# Patient Record
Sex: Male | Born: 1960 | Race: Black or African American | Hispanic: No | Marital: Single | Smoking: Former smoker
Health system: Southern US, Community
[De-identification: ages and names within clinical notes are randomized; demographics above are authoritative.]

## PROBLEM LIST (undated history)

## (undated) DIAGNOSIS — R51 Headache: Secondary | ICD-10-CM

## (undated) DIAGNOSIS — J3489 Other specified disorders of nose and nasal sinuses: Secondary | ICD-10-CM

## (undated) DIAGNOSIS — R519 Headache, unspecified: Secondary | ICD-10-CM

## (undated) DIAGNOSIS — M629 Disorder of muscle, unspecified: Secondary | ICD-10-CM

## (undated) HISTORY — DX: Disorder of muscle, unspecified: M62.9

## (undated) HISTORY — PX: PR UNLISTED PROCEDURE FOOT/TOES: 28899

## (undated) HISTORY — PX: FOOT SURGERY: SHX648

---

## 2011-03-26 ENCOUNTER — Ambulatory Visit (HOSPITAL_BASED_OUTPATIENT_CLINIC_OR_DEPARTMENT_OTHER): Payer: Self-pay | Attending: Physician Assistant

## 2011-03-26 ENCOUNTER — Ambulatory Visit (HOSPITAL_BASED_OUTPATIENT_CLINIC_OR_DEPARTMENT_OTHER): Payer: Self-pay | Admitting: Physician Assistant

## 2011-03-26 VITALS — BP 120/74 | HR 67 | Temp 97.7°F | Resp 18 | Wt 202.0 lb

## 2011-03-26 NOTE — Progress Notes (Signed)
Pt presents to the [facility] triage nurse today c/o chronic B ear discomfort and vertigo. Pt is triaged to walk-in appt at [facility] today. Pt verbalizes understanding and agreement with plan.

## 2011-03-29 NOTE — Progress Notes (Signed)
Outpatient Clinic  Note      DATE: 03/29/2011     Cristian Baker is a 50 year old male who is here today for walk in appt. An interpreter was not needed for the visit.    CHIEF COMPLAINT:  Chief Complaint   Patient presents with   . Ear Problem   . Dizziness       PAST MEDICAL HISTORY:  No past medical history on file.    PROBLEM LIST:  There is no problem list on file for this patient.      ALLERGIES:  Review of patient's allergies indicates no known allergies.    MEDICATIONS:  No current outpatient prescriptions on file.         INTERVAL HISTORY:  50 y/o male in today with c/o 2 month hx of vertigo, patient states that when sxs onset he was transferred to hospital in [place] ti r/o stroke. He underwent head imaging (NOS) and was told that it was nml. He has since been taking meclizine and sudafed, which he states helps. He denies any feelings of vertigo in the past 2 weeks, but states he still feels "foggy headed in the am "     REVIEW OF SYSTEMS:    CONSTITUTIONAL: Denies, fatigue, unexpected weight loss, unexpected weight gain, fever, chills  OPHTHALMIC: Denies, change in vision, blurry vision, diplopia, eye pain  RESPIRATORY: Denies, dyspnea on exertion  CARDIOVASCULAR: Denies, chest pain or pressure at rest or during exercise  NEUROLOGIC: Denies, headaches, focal weakness, signficant memory problems, sensory deficits, paresthesias, tremors, .  Does have and dizziness   PSYCHOSOCIAL: Denies and any psychological problems      SOCIAL HISTORY:  Living:  Homeless  Marital status: single  Employment history:   Substance Abuse: No  Comments:     PHYSICAL EXAM:  BP 120/74  Pulse 67  Temp(Src) 97.7 F (36.5 C) (Temporal)  Resp 18  Wt 202 lb (91.627 kg)  SpO2 100%      Skin:  Normal texture.  No suspicious skin lesions.  No rash.    Eyes:  + proptosis Conjunctiva and sclera clear. EOM full without nystagmus.   Pupils PERRLA with direct and consensual reflexes intact.  Fundoscopic exam shows sharp  disks, normal ZOX:WRUE ratio, and normal vasculature.    Ears, Nose, Mouth, Throat:  External ear and ear canal normal.  Tympanic membranes retracted.  Nose shows midline septum and normal pink nasal mucosa without discharge.  Mouth shows normal buccal mucosa.  Tongue normal.  Floor of mouth normal.  Oropharynx normal.  Neck supple without thyromegaly or lymphadenopathy.    Respiratory:  Normal chest excursions.  Clear to ausculation and percussion posteriorly.    Cardiovascular:  Ausculation normal without murmur, gallop, or rub.  Heart rate regular.   No carotid bruits.      Neurologic:  Cranial nerves II-XII grossly intact. Nystagmus noted at extreme endoints  Peripheral sensory and motor exams normal.  Reflexes at knees and ankles symmetrical and normal.   Gait normal.    Psychiatric:  Orients x 4.  Affect appropriate and not depressed talkative .  Thought patterns normal and not tangential.        ASSESSMENT AND PLAN:  Cristian Baker was seen today for ear problem and dizziness.    Diagnoses and associated orders for this visit:    Dizziness and giddiness possibly resolved   Hold meclazine - which may be contributing to his "foggy headedness"   Consider   - Fluticasone  Propionate 50 MCG/ACT Nasal Suspension; Spray 2 times into each nostril daily    Proptosis  -  considerTHYROID STIMULATING HORMONE  F/u in 1 week for re assessment and further intake

## 2011-04-08 ENCOUNTER — Ambulatory Visit (HOSPITAL_BASED_OUTPATIENT_CLINIC_OR_DEPARTMENT_OTHER): Payer: Self-pay | Attending: Physician Assistant | Admitting: Physician Assistant

## 2011-04-08 ENCOUNTER — Other Ambulatory Visit (HOSPITAL_BASED_OUTPATIENT_CLINIC_OR_DEPARTMENT_OTHER): Payer: Self-pay

## 2011-04-08 VITALS — BP 122/76 | HR 55 | Temp 96.1°F | Resp 20 | Wt 199.5 lb

## 2011-04-08 LAB — LIPID PANEL
Cholesterol (LDL): 160 mg/dL — ABNORMAL HIGH (ref <130)
Cholesterol/HDL Ratio: 4.9
HDL Cholesterol: 45 mg/dL (ref >40)
Non-HDL Cholesterol: 176 mg/dL — ABNORMAL HIGH (ref 0–159)
Total Cholesterol: 221 mg/dL — ABNORMAL HIGH (ref <200)
Triglyceride: 80 mg/dL (ref <150)

## 2011-04-08 LAB — COMPREHENSIVE METABOLIC PANEL
ALT (GPT): 23 U/L (ref 10–48)
AST (GOT): 22 U/L (ref 15–40)
Albumin: 4.3 g/dL (ref 3.5–5.2)
Alkaline Phosphatase (Total): 65 U/L (ref 39–139)
Anion Gap: 7 (ref 3–11)
Bilirubin (Total): 1.6 mg/dL — ABNORMAL HIGH (ref 0.2–1.3)
Calcium: 9.7 mg/dL (ref 8.9–10.2)
Carbon Dioxide, Total: 29 mEq/L (ref 22–32)
Chloride: 103 mEq/L (ref 98–108)
Creatinine: 0.9 mg/dL (ref 0.51–1.18)
GFR, Calc, African American: >60 mL/min (ref >59)
GFR, Calc, European American: >60 mL/min (ref >59)
Glucose: 83 mg/dL (ref 62–125)
Potassium: 4.5 mEq/L (ref 3.7–5.2)
Protein (Total): 7.3 g/dL (ref 6.0–8.2)
Sodium: 139 mEq/L (ref 136–145)
Urea Nitrogen: 13 mg/dL (ref 8–21)

## 2011-04-08 LAB — 1ST EXTRA GOLD TOP

## 2011-04-08 LAB — THYROID STIMULATING HORMONE: Thyroid Stimulating Hormone: 0.837 u[IU]/mL (ref 0.400–5.000)

## 2011-04-08 NOTE — Progress Notes (Signed)
Outpatient Clinic  Note        Cristian Baker is a 50 year old male who is here today for walk in appt. An interpreter was not needed for the visit.    CHIEF COMPLAINT:  Chief Complaint   Patient presents with   . Follow-Up        PAST MEDICAL HISTORY:  No past medical history on file.    PROBLEM LIST:  There is no problem list on file for this patient.      ALLERGIES:  Review of patient's allergies indicates no known allergies.    MEDICATIONS:  No current outpatient prescriptions on file.         INTERVAL HISTORY:  50 y/o male in today with c/o 2 month hx of vertigo, patient states that when sxs onset he was transferred to hospital in [place] ti r/o stroke. He underwent head imaging (NOS) and was told that it was nml. He has since been taking meclizine and sudafed, which he states helps. He denies any feelings of vertigo in the past 2 weeks, but states he still feels "foggy headed in the am " Seen x 2 weeks ago and instructed to hold meclazine. He has and states he has 0 dizziness in the interim.          REVIEW OF SYSTEMS:    CONSTITUTIONAL: Denies, fatigue, unexpected weight loss, unexpected weight gain, fever, chills  OPHTHALMIC: Denies, change in vision, blurry vision, diplopia, eye pain  RESPIRATORY: Denies, dyspnea on exertion  CARDIOVASCULAR: Denies, chest pain or pressure at rest or during exercise  NEUROLOGIC: Denies, headaches, focal weakness, signficant memory problems, sensory deficits, paresthesias, tremors, .  Does have and dizziness   PSYCHOSOCIAL: Denies and any psychological problems      SOCIAL HISTORY:  Living:  Homeless  Marital status: single  Employment history:   Substance Abuse: No  Comments:     PHYSICAL EXAM:  BP 122/76  Pulse 55  Temp(Src) 96.1 F (35.6 C) (Temporal)  Resp 20  Wt 199 lb 8 oz (90.493 kg)  SpO2 100%      Skin:  Normal texture.  No suspicious skin lesions.  No rash.    Respiratory:  Normal chest excursions.  Clear to ausculation and percussion  posteriorly.    Cardiovascular:  Ausculation normal without murmur, gallop, or rub.  Heart rate regular.   No carotid bruits.      Psychiatric:  Orients x 4.  Affect appropriate and not depressed talkative .  Thought patterns normal and not tangential.        ASSESSMENT AND PLAN:  Cristian Baker was seen today for ear problem and dizziness.    Diagnoses and associated orders for this visit:    Dizziness and giddiness possibly resolved   Continue to hold meclazine and use on PRN    Routine general medical examination at a health care facility  - LIPID PANEL  - THYROID STIMULATING HORMONE  - COMPREHENSIVE METABOLIC PANEL  - TDAP VACC, >7 YRS, 0.5 ML/IM    Will place on wait list for primary care.  F/u prn

## 2011-04-08 NOTE — Progress Notes (Signed)
VACCINE SCREENING/ORDER WHEN CONTRAINDICATIONS PRESENT    Order date: 04/08/2011  Ordering provider: Frederik Pear  Clinic stock used: YES   Interpreter used?: None  Vaccine information sheet(s) discussed, patient/parent/guardian verbalized understanding? YES   Vaccines given: Tdap Vaccine    SCREENING: INACTIVATED VACCINES  NO 1. Do you have a high fever, severe cold-like symptoms or serious infection?  NO 2. Do you have any food allergies such as eggs, chicken, chicken feathers, chicken dander, or gelatin?  NO 3. Do you have any allergies to neomycin, streptomycin, or polymyxin?   NO 4. Have you had any severe reaction to a vaccine in the past such as a seizure, a convulsion from a high fever, or a paralysis problem called Guillain-Barre Syndrome? If so, which vaccine(s):     Cristian Baker, Cristian Baker, 04/08/2011 8:08 AM

## 2011-07-11 ENCOUNTER — Ambulatory Visit (HOSPITAL_BASED_OUTPATIENT_CLINIC_OR_DEPARTMENT_OTHER)
Payer: Medicaid Other | Attending: Rehabilitative and Restorative Service Providers" | Admitting: Rehabilitative and Restorative Service Providers"

## 2011-07-11 ENCOUNTER — Emergency Department (HOSPITAL_BASED_OUTPATIENT_CLINIC_OR_DEPARTMENT_OTHER)
Admission: EM | Admit: 2011-07-11 | Discharge: 2011-07-11 | Disposition: A | Discharge status: Home / Self Care | Payer: Medicaid Other | Attending: Physician Assistant | Admitting: Physician Assistant

## 2011-07-11 DIAGNOSIS — M4120 Other idiopathic scoliosis, site unspecified: Secondary | ICD-10-CM | POA: Insufficient documentation

## 2011-07-11 DIAGNOSIS — M545 Low back pain, unspecified: Secondary | ICD-10-CM | POA: Insufficient documentation

## 2011-07-11 DIAGNOSIS — IMO0001 Reserved for inherently not codable concepts without codable children: Secondary | ICD-10-CM | POA: Insufficient documentation

## 2011-08-13 ENCOUNTER — Ambulatory Visit (HOSPITAL_BASED_OUTPATIENT_CLINIC_OR_DEPARTMENT_OTHER): Payer: Medicaid Other | Attending: Internal Medicine | Admitting: Internal Medicine

## 2011-08-13 ENCOUNTER — Encounter (HOSPITAL_BASED_OUTPATIENT_CLINIC_OR_DEPARTMENT_OTHER): Payer: Self-pay | Admitting: Internal Medicine

## 2011-08-13 ENCOUNTER — Other Ambulatory Visit (HOSPITAL_BASED_OUTPATIENT_CLINIC_OR_DEPARTMENT_OTHER): Payer: Self-pay | Admitting: Internal Medicine

## 2011-08-13 VITALS — BP 100/68 | HR 62 | Resp 18 | Ht 68.0 in | Wt 200.0 lb

## 2011-08-13 DIAGNOSIS — Z8673 Personal history of transient ischemic attack (TIA), and cerebral infarction without residual deficits: Secondary | ICD-10-CM | POA: Insufficient documentation

## 2011-08-13 DIAGNOSIS — M4120 Other idiopathic scoliosis, site unspecified: Secondary | ICD-10-CM | POA: Insufficient documentation

## 2011-08-13 DIAGNOSIS — R209 Unspecified disturbances of skin sensation: Secondary | ICD-10-CM | POA: Insufficient documentation

## 2011-08-13 DIAGNOSIS — Z139 Encounter for screening, unspecified: Secondary | ICD-10-CM | POA: Insufficient documentation

## 2011-08-13 DIAGNOSIS — M549 Dorsalgia, unspecified: Secondary | ICD-10-CM | POA: Insufficient documentation

## 2011-08-13 LAB — CBC (HEMOGRAM)
Hematocrit: 43 % (ref 38–50)
Hemoglobin: 13.9 g/dL (ref 13.0–18.0)
MCH: 30 pg (ref 27.3–33.6)
MCHC: 32.6 g/dL (ref 32.2–36.5)
MCV: 92 fL (ref 81–98)
Platelet Count: 133 10*3/uL — ABNORMAL LOW (ref 150–400)
RBC: 4.63 mil/uL (ref 4.40–5.60)
RDW-CV: 13.5 % (ref 11.6–14.4)
WBC: 7.06 10*3/uL (ref 4.30–10.00)

## 2011-08-13 LAB — VITAMIN D (25 HYDROXY)

## 2011-08-13 LAB — FOLATE & VITAMIN B12
Folate, SRM: 16.6 ng/mL (ref >5.8)
Vitamin B12 (Cobalamin): 281 pg/mL (ref 180–914)
Vitamin B12 (Cobalamin): NORMAL

## 2011-08-13 LAB — 1ST EXTRA LIME GREEN TOP

## 2011-08-13 MED ORDER — SIMVASTATIN 20 MG OR TABS
ORAL_TABLET | ORAL | Status: DC
Start: 2011-08-13 — End: 2011-11-26

## 2011-08-13 MED ORDER — ASPIRIN 81 MG OR TBEC
DELAYED_RELEASE_TABLET | ORAL | Status: AC
Start: 2011-08-13 — End: ?

## 2011-08-13 NOTE — Progress Notes (Addendum)
[facility] NOTE:    Chief Complaint   Patient presents with   . Establish Care       Cristian Baker is a 50 year old male who presents to the [facility] for care of the following problems:  1. History of vertigo. This first started acutely in June 2012. Was hospitalized in [place]. MRI brain showed a possible small subacute infarct in the midbrain. Carotid duplex was negative for high grade stenosis and TTE was negative for PFO. HgA1C 5.4, TSH 0.463, TChol 199, LDL 146, HDL 49.  EKG within normal limits. Neurology recommended that patient take aspirin and statin for secondary prevention. He was also discharge on meclizine for symptomatic relief. He has been seen in this clinic since and was taken off his meclizine. Today, Cristian Baker reports that his vertigo has improved.   2. Other complains of scoliosis and chronic back pain. Was seen in [facility] ED last month. XR noted sever thoracolumbar scoliosis. He was referred to PT on NOvember 25, but has not seen then since (states his phone was not working, but he has the phone number and plans on calling to schedule an appointment). He was also prescribed a muscle relaxant that helps a little. His back pain is better when he lays flat, worse with walking or sitting too long. Endorses periodic tingling and numbness. Denies weakness, has never fallen. Denies bladder or bowel incontinence.  3. Experiences seasonal allergies, worse in the summer time.    Cristian Baker denies any other medical history, hospitalizations, surgeries. Has never had a PCP before.    ALLERGIES  Review of patient's allergies indicates no known allergies.    MEDs:  None    FH:  HTN  Grandfather - MI/CVA  Denies FH of cancer.    SH:  [facility]. Originally from [place], lived in [place], [place] and [place] before moving to [place] 4 months ago.  Tobacco: Quit smoking 23 years ago (prior 63 pack/year history).  EtOH: Denies. Never previous heavy  use.  Illicits: Denies. Endorses prior h/o intranasal cocaine. Never used IVDU.    ROS:  Constitutional  - denies F/C, NS, weight loss  Eyes  - denies blurry vision or diplopia  ENMT  - denies rhinorrhea, sore throat. Endorses endorses occasional sinus congestion.    Cardiovascular  - denies CP, palpitations, orthopnea, PND, edema   Respiratory  - denies SOB, cough. Occasional mild DOE.  GI  - endorses abdominal hernia. denies N/V, D/C, melena, BRBPR    Genitourinary - denies increased frequency, urgency, hematuria, or dysuria    Neuro  - denies HA, weakness, left leg sensory loss, h/o sz, falls, LOC. Endorses occasional leg tingling.  Musculoskeletal  - endorses chronic back pain.  Skin   - denies rashes or lesions    Endocrine   - denies feelings of hot/cold, appetite changes     Heme/Lymph  - denies LAD, bruising, gum bleeding, epistaxis    Psychiatric   - endorses mild anxiety, depression secondary to homelessness   Allergy/Immunology- endorses h/o allergies. denies multiple infections/immune deficiency    Physical Examination:    BP 100/68  Pulse 62  Resp 18  Ht 5\' 8"  (1.727 m)  Wt 200 lb (90.719 kg)  BMI 30.41 kg/m2  PHYSICAL EXAM:  Gen - Well appearing, alert, NAD, pleasant  HEENT- EOMI, no scleral icterus, MMM   Lungs- breathing comfortably  CV: warm extremities, no edema  Abd: non distended  Ext: WWP, no edema, MAE  Skin: No rashes or lesions. No cyanosis or erythema.  Psych: Appropriate, normal affect, no apparent mood disturbances.   Neuro:  Mental Status: alert & oriented X 4   Cranial Nerves: II-XII grossly intact   Motor tone: normal   Atrophy/Fasiculations: absent   Strength: 5/5 in all muscle groups of the lower extremities  EXCEPT 4/5 in bilateral hip and knee extension.   Sensory: decreased throughout lateral left leg    Labs:   No visits with results within 3 Month(s) from this visit.  Latest known visit with results is:    Orders Only on 04/08/2011   Component Date Value   . 1st Extra Gold  Top 04/08/2011 Additional collection tube        Assessment and Plan:   Cristian Baker is a 50 year old male who presents with:       Cristian Baker was seen today for establish care.    Diagnoses and associated orders for this visit:    Encounter to establish care/ Screening, routine.  - HEPATITIS A,B,& C PANEL  - CBC (HEMOGRAM)  - Hemoccult x 1-3 for colon cancer screening, Future (on site resulted); Future    Paresthesia. Likely secondary to scoliosis as below. However will also work-up metabolic causes as patient has not had medical care previously.  - VITAMIN D (25 HYDROXY)  - FOLATE & VITAMIN B12  - SEROLOGIC SYPHILIS PANEL, SRM    Scoliosis/ Back pain/ weakness  -- patient will call PT and schedule appointment  -- Readdress EMG at next visit in light of weakness    H/o: cva (cardiovascular accident)/ Hyperlipidemia  - Re-prescribe Simvastatin 20 MG Oral Tab; Take 1 tablet by mouth every evening  - Re-prescribe Aspirin 81 MG Oral Tab EC; Take 1 tablet by mouth daily    Follow up Plan:  Return in about 2 weeks (around 08/27/2011) for with Milly Goggins. nursing visit next week for blood draw as we were not able to draw all labs while patient in clinic.    Patient was discussed with Dr. Antonieta Loveless.    Lillie Columbia, MD  Internal Medicine [facility]  [phone]     -------------------------------------------  Attending: Doristine Bosworth, MD  I discussed the history, exam and medical decision making for this patient with Dr.  Valla Leaver. I was present in the clinic during the provisions of these services with no other clinical duties. I concur with the management plan as discussed.  -------------------------------------------

## 2011-08-15 LAB — VITAMIN D (25 HYDROXY)
Vit D (25_Hydroxy) Total: 13.7 ng/mL — ABNORMAL LOW (ref 20.1–50.0)
Vitamin D2 (25_Hydroxy): <1 ng/mL
Vitamin D3 (25_Hydroxy): 13.7 ng/mL

## 2011-08-16 LAB — HEPATITIS A,B,& C PANEL
Hepatitis A Ab: NONREACTIVE
Hepatitis B Core Ab: NONREACTIVE
Hepatitis B Surf Antibody Intl Units: <1 IU
Hepatitis B Surface Ab: NONREACTIVE
Hepatitis B Surface Antigen w/Reflex: NONREACTIVE
Hepatitis C Antibody w/Rflx PCR: NONREACTIVE

## 2011-08-18 ENCOUNTER — Encounter (HOSPITAL_BASED_OUTPATIENT_CLINIC_OR_DEPARTMENT_OTHER): Payer: No Typology Code available for payment source

## 2011-08-18 LAB — SEROLOGIC SYPHILIS PANEL, SRM

## 2011-08-20 NOTE — Addendum Note (Signed)
Addended by: Doristine Bosworth on: 08/20/2011 09:00 PM     Modules accepted: Level of Service

## 2011-08-31 ENCOUNTER — Encounter (HOSPITAL_BASED_OUTPATIENT_CLINIC_OR_DEPARTMENT_OTHER): Payer: Medicaid Other | Admitting: Rehabilitative and Restorative Service Providers"

## 2011-09-03 ENCOUNTER — Other Ambulatory Visit (HOSPITAL_BASED_OUTPATIENT_CLINIC_OR_DEPARTMENT_OTHER): Payer: Self-pay | Admitting: Internal Medicine

## 2011-09-07 ENCOUNTER — Ambulatory Visit (HOSPITAL_BASED_OUTPATIENT_CLINIC_OR_DEPARTMENT_OTHER)
Payer: No Typology Code available for payment source | Attending: Physician Assistant | Admitting: Rehabilitative and Restorative Service Providers"

## 2011-09-07 DIAGNOSIS — IMO0001 Reserved for inherently not codable concepts without codable children: Secondary | ICD-10-CM | POA: Insufficient documentation

## 2011-09-07 DIAGNOSIS — M5380 Other specified dorsopathies, site unspecified: Secondary | ICD-10-CM | POA: Insufficient documentation

## 2011-09-07 DIAGNOSIS — M545 Low back pain, unspecified: Secondary | ICD-10-CM | POA: Insufficient documentation

## 2011-09-10 ENCOUNTER — Ambulatory Visit (HOSPITAL_BASED_OUTPATIENT_CLINIC_OR_DEPARTMENT_OTHER): Payer: No Typology Code available for payment source | Attending: Internal Medicine | Admitting: Internal Medicine

## 2011-09-10 VITALS — BP 98/66 | HR 69 | Temp 97.5°F | Resp 20 | Wt 203.5 lb

## 2011-09-10 DIAGNOSIS — Z8673 Personal history of transient ischemic attack (TIA), and cerebral infarction without residual deficits: Secondary | ICD-10-CM | POA: Insufficient documentation

## 2011-09-10 DIAGNOSIS — M4120 Other idiopathic scoliosis, site unspecified: Secondary | ICD-10-CM | POA: Insufficient documentation

## 2011-09-10 DIAGNOSIS — E559 Vitamin D deficiency, unspecified: Secondary | ICD-10-CM | POA: Insufficient documentation

## 2011-09-10 DIAGNOSIS — E785 Hyperlipidemia, unspecified: Secondary | ICD-10-CM | POA: Insufficient documentation

## 2011-09-10 MED ORDER — ERGOCALCIFEROL 1.25 MG (50000 UT) OR CAPS
ORAL_CAPSULE | ORAL | Status: AC
Start: 2011-09-10 — End: ?

## 2011-09-15 NOTE — Progress Notes (Signed)
[facility] NOTE:    No chief complaint on file.      Cristian Baker is a 51 year old male who presents to the [facility] for care of the following problems:     Cristian Baker returns to clinic today for lab results. At our last visit we drew lipids (patient has a history of HLD, but had been off his statin), folate/B12/VitD for paresthesias, and hepatitis panel for routine screening. His results are listed below.     Today he denies any current complaints or concerns. His scoliosis is stable, he saw PT and learn soon exercises he can do at home. Denies that his back pain, weakness and paresthesias are worse than ever before, reports his symptoms secondary to scoliosis have been stable for decades. Has not interest in undergoing EMG at this time, but reports that he will tell me if any of his symptoms change or worsen.  Denies bowel or bladder symptoms. Denies falls or numbness.    Patient Active Problem List   Diagnoses Date Noted   . Scoliosis [737.30] 08/16/2011     XR 07/11/11: severe thoracolumbar scoliosis.  To see PT soon.       . Paresthesias [782.0] 08/16/2011     Likely secondary to scoliosis, but checked RPR, Vitamin B12, folate, Vitamin D and electrolytes on 08/13/11. TSH normal in June.     . Weakness [780.79] 08/16/2011     Bilateral hip and knee extension. However difficult to tell if exam was limited by pain.   Discuss EMG at next appointment     . H/O: CVA (cardiovascular accident) [V12.54] 08/16/2011     Presented with vertigo. Small infarct in midbrain on MRI in June 2012 (in [place]). Carotid duplex and TTE normal. Prescribed aspirin and statin for secondary prevention.     . Seasonal allergies [477.9] 08/16/2011     Worse in summer time, both inside and outside.     Marland [facility] Healthcare maintenance [V70.0] 08/13/2011     -- vaccinations to discuss at future visit:   - Influenza - declined, worried about what they are putting in it.    - Pneumovax (if chronic dz, cigarrette  smokers, or >65yo. Revaccinate if receive vaccine when <65yo)    - Tetanus - aug 2012   - Zoster (if age >98yo)  -- HIV testing- tested negative one month ago at [facility] (?)  -- Hepatitis testing- 08/13/11  -- TB screening- declined PPD testing today  -- CV disease prevention: aspirin and statin restarted today (08/13/11).  -- HTN: BP WNLs (<140/90).  -- AAA screening with ultrasound not currently indicated: patient <65yo  -- Diabetes: hemoglobin A1C: 5.4% in June 2012  -- Cancer screening to discuss at future visit:   - Prostate: denies symptoms. No FH.                 - Colon: annual FOBT provided today (08/13/11)  -- Depression: assess mood and screen for anhedonia NEXT VISIT  -- Osteoporosis: DEXA scan (women > 65yo) -- n/a  -- Injury prevention (in elderly): n/a                  - review medications to minimize psychotropic medications                 - encourage weight-bearing activity                 - lab draw for Vitamin D today, 08/13/11                 -  if recent fall, perform Get Up and Go test  -- Domestic Violence screen: not indicated  -- Substance abuse: denies  -- Sexually transmitted infections: address at NEXT VISIT                 - Chlamydia PCR from urine or vaginal swab (sexually active women age 68-25, pregnant women, older    women or men with risk factors)                  - Romeo Rabon (women age 6-29 who live in high risk areas)  -- Hearing and vision screening (age > 6) - n/a   - Snellen chart   - "whispered voice test"          No past medical history on file.    ALLERGIES  Seasonal    Outpatient Prescriptions Prior to Visit   Medication Sig Dispense Refill   . Aspirin 81 MG Oral Tab EC Take 1 tablet by mouth daily  30 Tab  3   . Simvastatin 20 MG Oral Tab Take 1 tablet by mouth every evening  30 Tab  3       History     Social History   . Marital Status: Single     Spouse Name: N/A     Number of Children: N/A   . Years of Education: N/A     Social History Main Topics   . Smoking  status: Former Games developer   . Smokeless tobacco: Not on file   . Alcohol Use: No   . Drug Use: No   . Sexually Active: Not on file     Other Topics Concern   . Not on file     Social History Narrative   . No narrative on file         Physical Examination:    BP 98/66  Pulse 69  Temp(Src) 97.5 F (36.4 C) (Temporal)  Resp 20  Wt 203 lb 8 oz (92.307 kg)  SpO2 100%  PHYSICAL EXAM:  Gen - Well appearing, alert, NAD, pleasant  Lungs- breathing comfortable  Psych: Appropriate, normal affect, no apparent mood disturbances.     Labs:   Orders Only on 08/13/2011   Component Date Value   . 1st Extra Lime Green Top 08/13/2011 Additional collection tube    . Vitamin D2 (25_Hydroxy) 08/13/2011 <1.0    . Vitamin D3 (25_Hydroxy) 08/13/2011 13.7    . Vit D (25_Hydroxy) Total 08/13/2011 13.7*   . Vit D (25_Hydroxy) Interp 08/13/2011 Deficient:         8.0-20.0 ng/mL    . Vit D (25_Hydroxy) Interp 08/13/2011                      Value:For more information see                          [email or web address]                                                    .   Office Visit on 08/13/2011   Component Date Value   . HEPATITIS B_S AG 08/13/2011 Nonreactive    . HEPATITIS B_S AB 08/13/2011 Nonreactive    . INTERNATIONAL UNITS of A* 08/13/2011 <1.0    .  INTERNATIONAL UNITS of A* 08/13/2011 Protective level approximately 10 IU.    Marland Kitchen HEPATITIS B CORE AB 08/13/2011 Nonreactive    . HEPATITIS A AB 08/13/2011 Nonreactive    . IMMUNOGLOBULIN CLASS 08/13/2011 Test not applicable    . INTERPRETATION 08/13/2011                      Value:No evidence of past or present Hepatitis A or B                          infection.   Marland Kitchen HEPATITIS C AB W/RFLX PCR 08/13/2011 Nonreactive    . INTERPRETATION 08/13/2011                      Value:No evidence of antibody to hepatitis C virus (HCV).                          Anti-HCV may develop slowly (6 weeks - 6 months) over                          the course of hepatitis C virus  infection.  Rarely it                          may be absent in cases of chronic infection.  Anti-HCV                          may also disappear after recovery from acute,                          self-limited disease.   . WBC 08/13/2011 7.06    . RBC 08/13/2011 4.63    . HEMOGLOBIN 08/13/2011 13.9    . HEMATOCRIT 08/13/2011 43    . MCV 08/13/2011 92    . [facility] 08/13/2011 30.0    . MCHC 08/13/2011 32.6    . PLATELET COUNT 08/13/2011 133*   . RDW-CV 08/13/2011 13.5    . Vitamin D2 (25_Hydroxy) 08/13/2011 Reorder requested, quantity not sufficient    . Vitamin D2 (25_Hydroxy) 08/13/2011 RELOGGED TO PST TUBE SEE [id number]    . Vitamin D3 (25_Hydroxy) 08/13/2011 Reorder requested, quantity not sufficient    . Vitamin D3 (25_Hydroxy) 08/13/2011 RELOGGED TO PST TUBE SEE [id number]    . Vit D (25_Hydroxy) Total 08/13/2011 Reorder requested, quantity not sufficient    . Vit D (25_Hydroxy) Total 08/13/2011 RELOGGED TO PST TUBE SEE [id number]    . Vit D (25_Hydroxy) Interp 08/13/2011 Reorder requested, quantity not sufficient    . Vit D (25_Hydroxy) Interp 08/13/2011 RELOGGED TO PST TUBE SEE [id number]    . FOLATE 08/13/2011 16.6    . FOLATE 08/13/2011 Reference range >5.8 ng/mL    . FOLATE 08/13/2011 Folate deficiency <4.0 ng/mL    . VITAMIN B12 (COBALAMIN) 08/13/2011 281    . VITAMIN B12 (COBALAMIN) 08/13/2011        Normal:  180-914 pg/mL    . VITAMIN B12 (COBALAMIN) 08/13/2011 Indeterminate:  145-179 pg/mL    . VITAMIN B12 (COBALAMIN) 08/13/2011     Deficient:  <145 pg/mL    . Syphilis IgG Ab Result 08/13/2011 Reorder requested, quantity not sufficient*   . Syphilis IgG Ab Result 08/13/2011 PER IMMUNOLOGY [place]    . Syphilis  IgG Comment 08/13/2011 Reorder requested, quantity not sufficient    . Syphilis IgG Comment 08/13/2011 PER IMMUNOLOGY [place]    . Syphilis Screening Status 08/13/2011 Reorder requested, quantity not sufficient*   . Syphilis Screening Status 08/13/2011 PER IMMUNOLOGY [place]        Assessment and Plan:   Cristian Baker is  a 51 year old male who presents with:       Diagnoses and associated orders for this visit:    Vitamin d deficiency  - Ergocalciferol 50000 UNIT Oral Cap; take 1 capsule every Friday for the next 8 weeks  - recheck vitamin D level in 8.5 weeks    Hyperlipidemia, h/o CVA. Patient did not pick up simvastatin and ASA after our last visit for unclear reasons. He agrees to pick up today and restart.  - simvastatin 40mg / day  - aspirin 81mg  daily  - recheck lipids in 8.5 weeks    Scoliosis  - continue exercises as per PT  - consider EMG if symptoms change or worsen    Follow up Plan:  Return in about 9 weeks (around 11/12/2011).    Patient was discussed with Dr. Dawson Bills, MD  Internal Medicine [facility]  [phone]

## 2011-09-15 NOTE — Progress Notes (Signed)
Addition to assessment/plan:    HCM:  Hep A/B/C negative. Patient declined HepA/B vaccine, but agrees to continue to consider.  Influenza- [facility] the vaccine [facility] at the shelter.

## 2011-09-17 NOTE — Progress Notes (Signed)
-------------------------------------------    Attending: Lota Leamer Marion Katherleen Folkes, MD  I discussed the history, exam and medical decision making for this patient with Dr. DEMARS, SANDRA MARIE. I was present in the clinic during the provisions of these services with no other clinical duties. I concur with the management plan as noted above.  -------------------------------------------

## 2011-11-12 ENCOUNTER — Ambulatory Visit (HOSPITAL_BASED_OUTPATIENT_CLINIC_OR_DEPARTMENT_OTHER): Payer: 59 | Attending: Internal Medicine

## 2011-11-12 DIAGNOSIS — E785 Hyperlipidemia, unspecified: Secondary | ICD-10-CM | POA: Insufficient documentation

## 2011-11-12 DIAGNOSIS — E559 Vitamin D deficiency, unspecified: Secondary | ICD-10-CM | POA: Insufficient documentation

## 2011-11-12 DIAGNOSIS — Z0189 Encounter for other specified special examinations: Secondary | ICD-10-CM | POA: Insufficient documentation

## 2011-11-12 LAB — LIPID PANEL
Cholesterol (LDL): 134 mg/dL — ABNORMAL HIGH (ref <130)
Cholesterol/HDL Ratio: 5.2
HDL Cholesterol: 38 mg/dL — ABNORMAL LOW (ref >40)
Non-HDL Cholesterol: 160 mg/dL — ABNORMAL HIGH (ref 0–159)
Total Cholesterol: 198 mg/dL (ref <200)
Triglyceride: 130 mg/dL (ref <150)

## 2011-11-12 NOTE — Progress Notes (Signed)
[name] blood work drawn in clinic today.  Ordered by: Lillie Columbia  Performed by: Meredith Mody, MA  Patient left without complications.    Aniel, Cristoper, 11/12/2011 11:20 AM    Venipuncture Fee Ordered: yes

## 2011-11-14 LAB — VITAMIN D (25 HYDROXY)
Vit D (25_Hydroxy) Total: 12.9 ng/mL — ABNORMAL LOW (ref 20.1–50.0)
Vitamin D2 (25_Hydroxy): <1 ng/mL
Vitamin D3 (25_Hydroxy): 12.9 ng/mL

## 2011-11-26 ENCOUNTER — Encounter (HOSPITAL_BASED_OUTPATIENT_CLINIC_OR_DEPARTMENT_OTHER): Payer: Self-pay | Admitting: Internal Medicine

## 2011-11-26 ENCOUNTER — Ambulatory Visit (HOSPITAL_BASED_OUTPATIENT_CLINIC_OR_DEPARTMENT_OTHER): Payer: 59 | Attending: Internal Medicine | Admitting: Internal Medicine

## 2011-11-26 VITALS — BP 112/64 | HR 88 | Resp 18 | Ht 68.0 in | Wt 214.5 lb

## 2011-11-26 DIAGNOSIS — M549 Dorsalgia, unspecified: Secondary | ICD-10-CM | POA: Insufficient documentation

## 2011-11-26 DIAGNOSIS — M4150 Other secondary scoliosis, site unspecified: Secondary | ICD-10-CM | POA: Insufficient documentation

## 2011-11-26 DIAGNOSIS — E559 Vitamin D deficiency, unspecified: Secondary | ICD-10-CM | POA: Insufficient documentation

## 2011-11-26 DIAGNOSIS — Z8679 Personal history of other diseases of the circulatory system: Secondary | ICD-10-CM | POA: Insufficient documentation

## 2011-11-26 DIAGNOSIS — Z1211 Encounter for screening for malignant neoplasm of colon: Secondary | ICD-10-CM | POA: Insufficient documentation

## 2011-11-26 MED ORDER — SIMVASTATIN 20 MG OR TABS
ORAL_TABLET | ORAL | Status: AC
Start: 2011-11-26 — End: ?

## 2011-11-26 NOTE — Patient Instructions (Signed)
Please take aspirin and simvastatin daily as prescribed.  Please take high dose Vitamin D (ergocalciferol) once a week x 8 weeks as prescribed.  Please return your stool samples as soon as possible.  Continue your physical therapy exercises.  Come back and see me in clinic in 8 weeks.

## 2011-11-26 NOTE — Progress Notes (Signed)
[facility] NOTE:    Chief Complaint   Patient presents with   . Back Pain       Cristian Baker is a 51 year old male who presents to the [facility] for care of the following problems:     Patient Active Problem List    Diagnosis Date Noted   . Vitamin d deficiency [268.9] 11/26/2011     08/13/11: 13.7  Recheck on 11/12/11: 12.9. (repeat labs were ordered pre-repeat clinic visit at the time that VitD was prescribed in January, however patient never picked up his script.)  High dose repletion picked up on 11/26/11.     Marland Kitchen Scoliosis [737.30] 08/16/2011     XR 07/11/11: severe thoracolumbar scoliosis.  Saw PT and was educated about exercises to perform at home. Reported on 4/12 that he is doing these.       . Paresthesias [782.0] 08/16/2011     Likely secondary to scoliosis, but checked RPR, Vitamin B12, folate, Vitamin D and electrolytes on 08/13/11. TSH normal in June. Vitamin D low, ordered high dose repletion in January but patient never filled the prescription. Patient reported on 4/12 that he would pick up script today and start repletion.       . Weakness [780.79] 08/16/2011     Bilateral hip and knee extension. However difficult to tell if exam was limited by pain.   Consider discussing EMG at next appointment, if c/o weakness again.  On 4/12 patient denies weakness and falls.     . H/O: CVA (cardiovascular accident) [V12.54] 08/16/2011     Presented with vertigo. Small infarct in midbrain on MRI in June 2012 (in [place]). Carotid duplex and TTE normal. Prescribed aspirin and statin for secondary prevention, however discovered on 4/12 that patient has not been taking them. Had a long conversation about stroke and the fact that he is asymptomatic just means that he was fortunate. Emphasized that his risk factors are equivalent to people that have had devastating strokes.  Explained the mechanism of embolic stroke and hence the justification for why we want him to be on a statin and ASA.   Promised on 4/12 that he would start taking these medications..     . Seasonal allergies [477.9] 08/16/2011     Worse in summer time, both inside and outside.     Marland Kitchen Healthcare maintenance [V70.0] 08/13/2011     -- vaccinations to discuss at future visit:   - Influenza - declined, worried about what they are putting in it.    - Pneumovax - recommended on 11/26/11, but patient declined due to beliefs about vaccines being potentially harmful   - Tetanus - aug 2012   - Zoster (if age >109yo)  -- HIV testing- tested negative in 2012 at [facility] and again in 2013 at [facility]  -- Hepatitis testing- negative for A/B/C on 08/13/11. Declines vaccinations as above.  -- TB screening- declined PPD testing on 09/10/11   -- CV disease prevention: aspirin and statin restarted on 08/13/11, although on 11/26/11 patient reports he is not taking them and again agreed to start on 11/26/11.  -- HTN: BP WNLs (<140/90).  -- AAA screening with ultrasound not currently indicated: patient <65yo  -- Diabetes: hemoglobin A1C: 5.4% in June 2012  -- Cancer screening to discuss at future visit:   - Prostate: denies symptoms. No FH.                   -  Colon: annual FOBT provided today (11/26/11)  -- Depression: denies depression and anxiety on 11/26/11  -- Osteoporosis: DEXA scan (women > 65yo) -- n/a  -- Injury prevention (in elderly): n/a                  - review medications to minimize psychotropic medications                 - encourage weight-bearing activity                 - starting VitD repletion on 11/26/11                 - if recent fall, perform Get Up and Go test  -- Domestic Violence screen: not indicated  -- Substance abuse: denies  -- Sexually transmitted infections: address at NEXT VISIT                 - Chlamydia PCR from urine or vaginal swab (sexually active women age 8-25, pregnant women, older    women or men with risk factors)                  - Romeo Rabon (women age 72-29 who live in high risk  areas)  -- Hearing and vision screening (age > 18) - n/a   - Snellen chart   - "whispered voice test"          No past medical history on file.    ALLERGIES  Seasonal    Outpatient Prescriptions Prior to Visit   Medication Sig Dispense Refill   . Aspirin 81 MG Oral Tab EC Take 1 tablet by mouth daily  30 Tab  3   . Ergocalciferol 50000 UNIT Oral Cap take 1 capsule every Friday for the next 8 weeks  8 Cap  0   . Simvastatin 20 MG Oral Tab Take 1 tablet by mouth every evening  30 Tab  3       History     Social History   . Marital Status: Single     Spouse Name: N/A     Number of Children: N/A   . Years of Education: N/A     Social History Main Topics   . Smoking status: Former Games developer   . Smokeless tobacco: Not on file   . Alcohol Use: No   . Drug Use: No   . Sexually Active: Not on file     Other Topics Concern   . Not on file     Social History Narrative   . No narrative on file     Physical Examination:    BP 112/64  Pulse 88  Resp 18  Ht 5\' 8"  (1.727 m)  Wt 214 lb 8 oz (97.297 kg)  BMI 32.61 kg/m2  PHYSICAL EXAM:  Gen - Well appearing, alert, NAD, pleasant  HEENT- EOMI, no scleral icterus  Lungs- breathing comfortably  Ext: WWP  Skin: No rashes or lesions. No cyanosis or erythema.  Psych: Appropriate, normal affect, no apparent mood disturbances.   Neuro: grossly intact    Labs:   Office Visit on 11/12/2011   Component Date Value   . Vitamin D2 (25_Hydroxy) 11/12/2011 <1.0    . Vitamin D3 (25_Hydroxy) 11/12/2011 12.9    . Vit D (25_Hydroxy) Total 11/12/2011 12.9*   . Vit D (25_Hydroxy) Interp 11/12/2011 Deficient:         8.0-20.0 ng/mL    . Vit D (25_Hydroxy) Interp 11/12/2011  Value:For more information see                          http://web.labmed.PhoneTrainer.no                                                    .   . CHOLESTEROL (TOTAL) 11/12/2011 198    . TRIGLYCERIDE 11/12/2011 130    . CHOLESTEROL (HDL) 11/12/2011 38*   . CHOLESTEROL (LDL) 11/12/2011 134*   .  Non-HDL Cholesterol 11/12/2011 160*   . CHOLESTEROL/HDL RATIO 11/12/2011 5.2    . Lipid Panel, Additional * 11/12/2011                      Value:                          (NOTE)                          For complete information to help interpret the lipid                           panel, please                           use the National Cholesterol Education Program (NCEP)                           guideline based                           reference range comments found here:                          http://web.labmed.TelephoneAid.tn       Assessment and Plan:   Kayo Zion is a 51 year old male who presents with:       Birt was seen today for back pain.    Diagnoses and associated orders for this visit:    Vitamin d deficiency  -      Pick up your high dose vitamin D prescription  -      RTC in 7 wk to have your VitD level rechecked    H/o: cva (cardiovascular accident)  - Simvastatin 20 MG Oral Tab; Take 2 tablet by mouth every evening  -  Continue daily baby aspirin  -     RTC in 7 wk to have your lipids rechecked    Screening  - OCCULT BLOOD BY IA, STL ([facility] ONLY)    Scoliosis  -      Continue PT exercises on your own at home    Follow up Plan:  Return in about 8 weeks (around 01/21/2012), or with [facility].    Patient was discussed with Dr. Mardene Speak.    Lillie Columbia, MD  Internal Medicine R3  [phone]

## 2011-11-29 NOTE — Progress Notes (Signed)
-------------------------------------------    Attending: Taichi Repka Marion Parag Dorton, MD  I discussed the history, exam and medical decision making for this patient with Dr. DEMARS, SANDRA MARIE. I was present in the clinic during the provisions of these services with no other clinical duties. I concur with the management plan as noted above.  -------------------------------------------  No results found for any visits on 11/26/11.

## 2011-12-22 ENCOUNTER — Ambulatory Visit (HOSPITAL_BASED_OUTPATIENT_CLINIC_OR_DEPARTMENT_OTHER)
Admit: 2011-12-22 | Discharge: 2011-12-22 | Disposition: A | Discharge status: Home / Self Care | Payer: 59 | Attending: Internal Medicine | Admitting: Internal Medicine

## 2011-12-22 DIAGNOSIS — Z139 Encounter for screening, unspecified: Secondary | ICD-10-CM | POA: Insufficient documentation

## 2011-12-23 LAB — OCCULT BLOOD BY IA, STL
Occult Bld 1 Result: NEGATIVE
Occult Bld 2 Result: NEGATIVE
Occult Bld 3 Result: NEGATIVE

## 2011-12-24 ENCOUNTER — Other Ambulatory Visit (HOSPITAL_BASED_OUTPATIENT_CLINIC_OR_DEPARTMENT_OTHER): Payer: Self-pay | Admitting: Internal Medicine

## 2012-01-21 ENCOUNTER — Encounter (HOSPITAL_BASED_OUTPATIENT_CLINIC_OR_DEPARTMENT_OTHER): Payer: 59 | Admitting: Internal Medicine

## 2012-02-04 ENCOUNTER — Encounter (HOSPITAL_BASED_OUTPATIENT_CLINIC_OR_DEPARTMENT_OTHER): Payer: 59 | Admitting: Internal Medicine

## 2012-02-21 ENCOUNTER — Ambulatory Visit (HOSPITAL_BASED_OUTPATIENT_CLINIC_OR_DEPARTMENT_OTHER): Payer: 59

## 2012-02-21 ENCOUNTER — Emergency Department (HOSPITAL_BASED_OUTPATIENT_CLINIC_OR_DEPARTMENT_OTHER)
Admission: EM | Admit: 2012-02-21 | Discharge: 2012-02-21 | Disposition: A | Discharge status: Home / Self Care | Payer: 59 | Attending: Emergency Medicine | Admitting: Emergency Medicine

## 2012-02-21 DIAGNOSIS — R112 Nausea with vomiting, unspecified: Secondary | ICD-10-CM | POA: Insufficient documentation

## 2012-02-21 DIAGNOSIS — R197 Diarrhea, unspecified: Secondary | ICD-10-CM | POA: Insufficient documentation

## 2012-02-21 NOTE — Progress Notes (Signed)
Pt presents to [facility] triage nurse this morning c/o diarrhea and vomiting x 1 day.  As no walk-in appointments are available at [facility] today, triage nurse encourages pt to seek eval at [facility] Emergency Room now. Pt verbalizes understanding and agreement with plan.

## 2012-03-13 ENCOUNTER — Ambulatory Visit (HOSPITAL_BASED_OUTPATIENT_CLINIC_OR_DEPARTMENT_OTHER): Payer: 59 | Attending: Internal Medicine | Admitting: Critical Care Medicine

## 2012-03-13 VITALS — BP 104/72 | HR 87 | Temp 97.5°F | Resp 20 | Ht 68.0 in | Wt 212.0 lb

## 2012-03-13 DIAGNOSIS — Z0000 Encounter for general adult medical examination without abnormal findings: Secondary | ICD-10-CM | POA: Insufficient documentation

## 2012-03-13 DIAGNOSIS — N400 Enlarged prostate without lower urinary tract symptoms: Secondary | ICD-10-CM | POA: Insufficient documentation

## 2012-03-13 DIAGNOSIS — Z8673 Personal history of transient ischemic attack (TIA), and cerebral infarction without residual deficits: Secondary | ICD-10-CM | POA: Insufficient documentation

## 2012-03-13 LAB — LIPID PANEL
Cholesterol (LDL): 156 mg/dL — ABNORMAL HIGH (ref <130)
Cholesterol/HDL Ratio: 6.3
HDL Cholesterol: 34 mg/dL — ABNORMAL LOW (ref >40)
Non-HDL Cholesterol: 181 mg/dL — ABNORMAL HIGH (ref 0–159)
Total Cholesterol: 215 mg/dL — ABNORMAL HIGH (ref <200)
Triglyceride: 123 mg/dL (ref <150)

## 2012-03-13 NOTE — Progress Notes (Addendum)
[facility]    CHIEF COMPLAINT:  Chief Complaint   Patient presents with   . Establish Care   . Results     HISTORY OF PRESENT ILLNESS  Cristian Baker is a 51 year old male former Lillie Columbia patient who is here today for followup.    Hx of CVA - no new neurological symptoms. Has not been taking his ASA or statin as he doubts that they are necessary. I discussed at length that these are needed but he remains reticent to take medications. I urged him to reconsider. We will check labs to gather some objective data about his risk and discuss further.    Sinus headaches - Has sinus symptoms with associated headaches. Takes OTC medications which seem to work.     BPH - some difficulty with voiding, which he finds tolerable. We will discuss starting additional meds at next visit    Parasthesias/weakness - stable minor weakness in bilateral hips, pt does not want further workup at this point    HCM - Checking HbA1c and lipids, pt declines vaccinations or PPD.      PAST MEDICAL HISTORY:  Patient Active Problem List   Diagnosis   . Healthcare maintenance   . Scoliosis   . Paresthesias   . Weakness   . H/O: CVA (cardiovascular accident)   . Seasonal allergies   . Vitamin d deficiency     ALLERGIES:  Cholestatin    MEDICATIONS:  Current Outpatient Prescriptions   Medication Sig   . Aspirin 81 MG Oral Tab EC Take 1 tablet by mouth daily   . Ergocalciferol 50000 UNIT Oral Cap take 1 capsule every Friday for the next 8 weeks   . Simvastatin 20 MG Oral Tab Take 2 tablet by mouth every evening     SOCIAL HISTORY:  Staying at [facility].  Denies toxic habits    FAMILY HISTORY:  family history is not on file.    REVIEW OF SYSTEMS:  Negative for all systems except as noted above    PHYSICAL EXAM:  VS: BP 104/72  Pulse 87  Temp 97.5 F (36.4 C) (Temporal)  Resp 20  Ht 5\' 8"  (1.727 m)  Wt 212 lb (96.163 kg)  BMI 32.23 kg/m2  SpO2 100%    BP Readings from Last 5 Encounters:   11/26/11  112/64   09/10/11 98/66   08/13/11 100/68   04/08/11 122/76   03/26/11 120/74     Wt Readings from Last 5 Encounters:   11/26/11 214 lb 8 oz (97.297 kg)   09/10/11 203 lb 8 oz (92.307 kg)   08/13/11 200 lb (90.719 kg)   04/08/11 199 lb 8 oz (90.493 kg)   03/26/11 202 lb (91.627 kg)     General: healthy, pleasant male  Skin: no rash or lesions  Head: anicteric sclera, PERRL  Lungs: breathing comfortably, lungs clear to auscultation bilaterally  Heart: regular, no murmurs/rubs/gallops  Abd: soft, non-tender, non-distended, bowel sounds presnts  Extr: warm well perfused    LABS:  Office Visit on 11/26/11   OCCULT BLOOD BY IA, STL ([facility] ONLY)       Component Value Range    Occult Bld 1 Collect Date Information not provided      Occult Bld 1 Collect Time Information not provided      Occult Bld 1 Result Negative  NRN    Occult Bld 2 Collect Date Information not provided  Occult Bld 2 Collect Time Information not provided      Occult Bld 2 Result Negative  NRN    Occult Bld 3 Collect Date Information not provided      Occult Bld 3 Collect Time Information not provided      Occult Bld 3 Result Negative  NRN     IMAGING:  No new imaging    ASSESSMENT AND PLAN:  Cristian Baker was seen today for establish care and results. Diagnoses and associated orders for this visit:    # History of CVA  - Urge compliance with ASA and statin  - Check lipids and HbA1c for risk assessment  - LIPID PANEL  - HEMOGLOBIN A1C, HPLC    # BPH - pt content to take herbal meds for now, symptoms tolerable  - Monitor for now    # Healthcare maintenance    - PPD/TUBERCULIN SKIN TEST 5 UNITS / 0.1 ML INTRADERMAL [86580]  - REFERRAL TO EYE CARE    Patient Active Problem List    Diagnosis Date Noted   . Vitamin d deficiency [268.9] 11/26/2011     08/13/11: 13.7  Recheck on 11/12/11: 12.9. (repeat labs were ordered pre-repeat clinic visit at the time that VitD was prescribed in January, however patient never picked up his script.)  High dose repletion picked up  on 11/26/11.     Marland Kitchen Scoliosis [737.30] 08/16/2011     XR 07/11/11: severe thoracolumbar scoliosis.  Saw PT and was educated about exercises to perform at home. Reported on 4/12 that he is doing these.       . Paresthesias [782.0] 08/16/2011     Likely secondary to scoliosis, but checked RPR, Vitamin B12, folate, Vitamin D and electrolytes on 08/13/11. TSH normal in June. Vitamin D low, ordered high dose repletion in January but patient never filled the prescription. Patient reported on 4/12 that he would pick up script today and start repletion.       . Weakness [780.79] 08/16/2011     Bilateral hip and knee extension. However difficult to tell if exam was limited by pain.   Consider discussing EMG at next appointment, if c/o weakness again.  On 4/12 patient denies weakness and falls.     . H/O: CVA (cardiovascular accident) [V12.54] 08/16/2011     Presented with vertigo. Small infarct in midbrain on MRI in June 2012 (in [place]). Carotid duplex and TTE normal. Prescribed aspirin and statin for secondary prevention, however discovered on 4/12 that patient has not been taking them. Had a long conversation about stroke and the fact that he is asymptomatic just means that he was fortunate. Emphasized that his risk factors are equivalent to people that have had devastating strokes.  Explained the mechanism of embolic stroke and hence the justification for why we want him to be on a statin and ASA.  Promised on 4/12 that he would start taking these medications..     . Seasonal allergies [477.9] 08/16/2011     Worse in summer time, both inside and outside.     Marland Kitchen Healthcare maintenance [V70.0] 08/13/2011     -- vaccinations to discuss at future visit:   - Influenza - declined, worried about what they are putting in it.    - Pneumovax - recommended on 11/26/11, but patient declined due to beliefs about vaccines being potentially harmful   - Tetanus - aug 2012   - Zoster (if age >101yo)  -- HIV testing- tested negative in 2012  at [facility]  and again in 2013 at [facility]  -- Hepatitis testing- negative for A/B/C on 08/13/11. Declines vaccinations as above.  -- TB screening- declined PPD testing on 09/10/11   -- CV disease prevention: aspirin and statin restarted on 08/13/11, although on 11/26/11 patient reports he is not taking them and again agreed to start on 11/26/11.  -- HTN: BP WNLs (<140/90).  -- AAA screening with ultrasound not currently indicated: patient <65yo  -- Diabetes: hemoglobin A1C: 5.4% in June 2012  -- Cancer screening to discuss at future visit:   - Prostate: denies symptoms. No FH.                   - Colon: annual FOBT provided today (11/26/11)  -- Depression: denies depression and anxiety on 11/26/11  -- Osteoporosis: DEXA scan (women > 65yo) -- n/a  -- Injury prevention (in elderly): n/a                  - review medications to minimize psychotropic medications                 - encourage weight-bearing activity                 - starting VitD repletion on 11/26/11                 - if recent fall, perform Get Up and Go test  -- Domestic Violence screen: not indicated  -- Substance abuse: denies  -- Sexually transmitted infections: address at NEXT VISIT                 - Chlamydia PCR from urine or vaginal swab (sexually active women age 72-25, pregnant women, older    women or men with risk factors)                  - Romeo Rabon (women age 19-29 who live in high risk areas)  -- Hearing and vision screening (age > 78) - n/a   - Snellen chart   - "whispered voice test"          Discussed with Dr. Aura Camps    RTC in 1-2 months    Genelle Bal, M.D.  PGY3 Internal Medicine Resident  Pager [phone]    -------------------------------------------  Attending: Cleatrice Burke, MD  I discussed the history, exam and medical decision making for this patient with Dr. Loraine Leriche, Marlou Porch. I was present in the clinic during the provisions of these services with no other clinical duties. I concur with the  management plan as noted above.    -------------------------------------------

## 2012-03-14 LAB — HEMOGLOBIN A1C, HPLC: Hemoglobin A1C: 5.4 % (ref 4.0–6.0)

## 2012-04-12 ENCOUNTER — Ambulatory Visit (HOSPITAL_BASED_OUTPATIENT_CLINIC_OR_DEPARTMENT_OTHER): Payer: 59 | Admitting: Critical Care Medicine

## 2012-04-12 NOTE — Progress Notes (Signed)
[facility]    NO SHOW NOTE    This patient failed a scheduled appointment today. Had the patient been present today we would have discussed:    Hx of CVA -   Patient has been poorly compliant with statin. On last visit we rechecked lipids and Hba1c to assess modifiable risk factors. Lipids were elevated (total 215, LDL 156, HDL 34). In light of this the patient must be strongly encouraged to resume taking his statin.     Disposition: Chart reviewed, fu necessary, pt should be rescheduled with me or another provider    Discussed with Dr. Antonieta Loveless    RTC as available    Genelle Bal, M.D.  [facility] Internal Medicine Resident  Pager [phone]

## 2012-06-08 ENCOUNTER — Ambulatory Visit: Payer: 59

## 2012-06-14 ENCOUNTER — Ambulatory Visit: Payer: 59

## 2012-07-24 ENCOUNTER — Other Ambulatory Visit (HOSPITAL_BASED_OUTPATIENT_CLINIC_OR_DEPARTMENT_OTHER): Payer: Self-pay | Admitting: Internal Medicine

## 2012-07-24 ENCOUNTER — Other Ambulatory Visit (HOSPITAL_BASED_OUTPATIENT_CLINIC_OR_DEPARTMENT_OTHER): Payer: 59

## 2012-07-31 LAB — PR MRI BRAIN BRAIN STEM W/O W/CONTRAST MATERIAL

## 2013-04-30 ENCOUNTER — Inpatient Hospital Stay (HOSPITAL_BASED_OUTPATIENT_CLINIC_OR_DEPARTMENT_OTHER)
Admission: AD | Admit: 2013-04-30 | Discharge: 2013-05-04 | DRG: 342 | Disposition: A | Discharge status: Home / Self Care | Payer: 59 | Attending: Orthopaedic Surgery | Admitting: Orthopaedic Surgery

## 2013-04-30 ENCOUNTER — Inpatient Hospital Stay (HOSPITAL_COMMUNITY): Payer: 59 | Admitting: Orthopaedic Surgery

## 2013-04-30 DIAGNOSIS — S92213A Displaced fracture of cuboid bone of unspecified foot, initial encounter for closed fracture: Secondary | ICD-10-CM | POA: Diagnosis present

## 2013-04-30 DIAGNOSIS — Z5900 Homelessness unspecified: Secondary | ICD-10-CM

## 2013-04-30 DIAGNOSIS — S92253A Displaced fracture of navicular [scaphoid] of unspecified foot, initial encounter for closed fracture: Principal | ICD-10-CM | POA: Diagnosis present

## 2013-04-30 DIAGNOSIS — X500XXA Overexertion from strenuous movement or load, initial encounter: Secondary | ICD-10-CM | POA: Diagnosis present

## 2013-05-01 ENCOUNTER — Other Ambulatory Visit (HOSPITAL_COMMUNITY): Payer: Self-pay | Admitting: Orthopaedic Surgery

## 2013-05-01 ENCOUNTER — Other Ambulatory Visit (HOSPITAL_COMMUNITY): Payer: Self-pay | Admitting: Adult Health

## 2013-05-01 ENCOUNTER — Other Ambulatory Visit (HOSPITAL_COMMUNITY): Payer: Self-pay | Admitting: Acute Care

## 2013-05-01 LAB — BASIC METABOLIC PANEL
Anion Gap: 5 (ref 3–11)
Calcium: 9.8 mg/dL (ref 8.9–10.2)
Carbon Dioxide, Total: 30 mEq/L (ref 22–32)
Chloride: 102 mEq/L (ref 98–108)
Creatinine: 1.03 mg/dL (ref 0.51–1.18)
GFR, Calc, African American: >60 mL/min (ref >59)
GFR, Calc, European American: >60 mL/min (ref >59)
Glucose: 117 mg/dL (ref 62–125)
Potassium: 4.5 mEq/L (ref 3.7–5.2)
Sodium: 137 mEq/L (ref 136–145)
Urea Nitrogen: 12 mg/dL (ref 8–21)

## 2013-05-01 LAB — TRANSTHYRETIN (PRE-ALBUMIN): Transthyretin (Pre_Albumin): 17.2 mg/dL — ABNORMAL LOW (ref 19.0–45.0)

## 2013-05-01 LAB — C_REACTIVE PROTEIN: C_Reactive Protein: 8.1 mg/L (ref 0.0–10.0)

## 2013-05-01 LAB — CBC (HEMOGRAM)
Hematocrit: 44 % (ref 38–50)
Hemoglobin: 14.5 g/dL (ref 13.0–18.0)
MCH: 30.9 pg (ref 27.3–33.6)
MCHC: 33 g/dL (ref 32.2–36.5)
MCV: 94 fL (ref 81–98)
Platelet Count: 158 10*3/uL (ref 150–400)
RBC: 4.69 mil/uL (ref 4.40–5.60)
RDW-CV: 13.4 % (ref 11.6–14.4)
WBC: 12.2 10*3/uL — ABNORMAL HIGH (ref 4.30–10.00)

## 2013-05-01 LAB — ALBUMIN, SERUM: Albumin: 4.7 g/dL (ref 3.5–5.2)

## 2013-05-01 LAB — TRANSFERRIN: Transferrin: 199 mg/dL (ref 180–329)

## 2013-05-02 ENCOUNTER — Other Ambulatory Visit (HOSPITAL_COMMUNITY): Payer: Self-pay | Admitting: Acute Care

## 2013-05-02 LAB — CBC (HEMOGRAM)
Hematocrit: 46 % (ref 38–50)
Hemoglobin: 14.9 g/dL (ref 13.0–18.0)
MCH: 30.9 pg (ref 27.3–33.6)
MCHC: 32.7 g/dL (ref 32.2–36.5)
MCV: 94 fL (ref 81–98)
Platelet Count: 153 10*3/uL (ref 150–400)
RBC: 4.82 mil/uL (ref 4.40–5.60)
RDW-CV: 13.3 % (ref 11.6–14.4)
WBC: 8.85 10*3/uL (ref 4.30–10.00)

## 2013-05-02 LAB — BASIC METABOLIC PANEL
Anion Gap: 6 (ref 3–11)
Calcium: 9.5 mg/dL (ref 8.9–10.2)
Carbon Dioxide, Total: 30 mEq/L (ref 22–32)
Chloride: 101 mEq/L (ref 98–108)
Creatinine: 0.95 mg/dL (ref 0.51–1.18)
GFR, Calc, African American: >60 mL/min (ref >59)
GFR, Calc, European American: >60 mL/min (ref >59)
Glucose: 108 mg/dL (ref 62–125)
Potassium: 4.4 mEq/L (ref 3.7–5.2)
Sodium: 137 mEq/L (ref 136–145)
Urea Nitrogen: 11 mg/dL (ref 8–21)

## 2013-05-03 ENCOUNTER — Other Ambulatory Visit (HOSPITAL_COMMUNITY): Payer: Self-pay | Admitting: Acute Care

## 2013-05-03 LAB — BASIC METABOLIC PANEL
Anion Gap: 5 (ref 3–11)
Calcium: 8.8 mg/dL — ABNORMAL LOW (ref 8.9–10.2)
Carbon Dioxide, Total: 29 mEq/L (ref 22–32)
Chloride: 100 mEq/L (ref 98–108)
Creatinine: 1.07 mg/dL (ref 0.51–1.18)
GFR, Calc, African American: >60 mL/min (ref >59)
GFR, Calc, European American: >60 mL/min (ref >59)
Glucose: 101 mg/dL (ref 62–125)
Potassium: 4.1 mEq/L (ref 3.7–5.2)
Sodium: 134 mEq/L — ABNORMAL LOW (ref 136–145)
Urea Nitrogen: 16 mg/dL (ref 8–21)

## 2013-05-03 LAB — CBC (HEMOGRAM)
Hematocrit: 41 % (ref 38–50)
Hemoglobin: 13.6 g/dL (ref 13.0–18.0)
MCH: 30.9 pg (ref 27.3–33.6)
MCHC: 33 g/dL (ref 32.2–36.5)
MCV: 94 fL (ref 81–98)
Platelet Count: 151 10*3/uL (ref 150–400)
RBC: 4.4 mil/uL (ref 4.40–5.60)
RDW-CV: 13.1 % (ref 11.6–14.4)
WBC: 11.13 10*3/uL — ABNORMAL HIGH (ref 4.30–10.00)

## 2013-05-03 LAB — VITAMIN D (25 HYDROXY)
Vit D (25_Hydroxy) Total: 14.6 ng/mL — ABNORMAL LOW (ref 20.1–50.0)
Vitamin D2 (25_Hydroxy): <1 ng/mL
Vitamin D3 (25_Hydroxy): 14.6 ng/mL

## 2013-05-04 ENCOUNTER — Other Ambulatory Visit (HOSPITAL_COMMUNITY): Payer: Self-pay | Admitting: Acute Care

## 2013-05-04 LAB — BASIC METABOLIC PANEL
Anion Gap: 7 (ref 3–11)
Calcium: 9.2 mg/dL (ref 8.9–10.2)
Carbon Dioxide, Total: 28 mEq/L (ref 22–32)
Chloride: 100 mEq/L (ref 98–108)
Creatinine: 0.91 mg/dL (ref 0.51–1.18)
GFR, Calc, African American: >60 mL/min (ref >59)
GFR, Calc, European American: >60 mL/min (ref >59)
Glucose: 90 mg/dL (ref 62–125)
Potassium: 3.7 mEq/L (ref 3.7–5.2)
Sodium: 135 mEq/L — ABNORMAL LOW (ref 136–145)
Urea Nitrogen: 18 mg/dL (ref 8–21)

## 2013-05-04 LAB — ALBUMIN, SERUM: Albumin: 4.1 g/dL (ref 3.5–5.2)

## 2013-05-04 LAB — CBC (HEMOGRAM)
Hematocrit: 43 % (ref 38–50)
Hemoglobin: 14.2 g/dL (ref 13.0–18.0)
MCH: 31 pg (ref 27.3–33.6)
MCHC: 33 g/dL (ref 32.2–36.5)
MCV: 94 fL (ref 81–98)
Platelet Count: 162 10*3/uL (ref 150–400)
RBC: 4.58 mil/uL (ref 4.40–5.60)
RDW-CV: 13.2 % (ref 11.6–14.4)
WBC: 9.24 10*3/uL (ref 4.30–10.00)

## 2013-05-04 LAB — TRANSFERRIN: Transferrin: 153 mg/dL — ABNORMAL LOW (ref 180–329)

## 2013-05-04 LAB — TRANSTHYRETIN (PRE-ALBUMIN): Transthyretin (Pre_Albumin): 11.2 mg/dL — ABNORMAL LOW (ref 19.0–45.0)

## 2013-05-04 LAB — R/O STAPH AUREUS C/S

## 2013-05-04 LAB — C_REACTIVE PROTEIN: C_Reactive Protein: 105.4 mg/L — ABNORMAL HIGH (ref 0.0–10.0)

## 2013-05-11 ENCOUNTER — Encounter (HOSPITAL_BASED_OUTPATIENT_CLINIC_OR_DEPARTMENT_OTHER): Payer: Self-pay | Admitting: Nurse Practitioner

## 2013-05-22 ENCOUNTER — Emergency Department (HOSPITAL_BASED_OUTPATIENT_CLINIC_OR_DEPARTMENT_OTHER)
Admission: EM | Admit: 2013-05-22 | Discharge: 2013-05-22 | Disposition: A | Discharge status: Other Acute Inpatient Hospital | Payer: 59 | Source: Ambulatory Visit | Attending: Emergency Medicine | Admitting: Emergency Medicine

## 2013-05-22 ENCOUNTER — Ambulatory Visit (HOSPITAL_BASED_OUTPATIENT_CLINIC_OR_DEPARTMENT_OTHER): Payer: 59 | Attending: Orthopaedic Surgery

## 2013-05-22 ENCOUNTER — Ambulatory Visit (HOSPITAL_BASED_OUTPATIENT_CLINIC_OR_DEPARTMENT_OTHER): Payer: 59

## 2013-05-22 ENCOUNTER — Other Ambulatory Visit (EMERGENCY_DEPARTMENT_HOSPITAL): Payer: Self-pay | Admitting: Emergency Medicine

## 2013-05-22 ENCOUNTER — Encounter (HOSPITAL_BASED_OUTPATIENT_CLINIC_OR_DEPARTMENT_OTHER): Payer: Self-pay

## 2013-05-22 VITALS — BP 137/84 | HR 91 | Resp 16 | Ht 68.0 in | Wt 212.0 lb

## 2013-05-22 DIAGNOSIS — M79609 Pain in unspecified limb: Secondary | ICD-10-CM | POA: Insufficient documentation

## 2013-05-22 DIAGNOSIS — I82409 Acute embolism and thrombosis of unspecified deep veins of unspecified lower extremity: Secondary | ICD-10-CM | POA: Insufficient documentation

## 2013-05-22 DIAGNOSIS — Z5189 Encounter for other specified aftercare: Secondary | ICD-10-CM | POA: Insufficient documentation

## 2013-05-22 DIAGNOSIS — I80299 Phlebitis and thrombophlebitis of other deep vessels of unspecified lower extremity: Secondary | ICD-10-CM | POA: Insufficient documentation

## 2013-05-22 LAB — CBC (HEMOGRAM)
Hematocrit: 40 (ref 38–50)
Hemoglobin: 13.3 (ref 13.0–18.0)
MCH: 31.2 (ref 27.3–33.6)
MCHC: 33.6 (ref 32.2–36.5)
MCV: 93 (ref 81–98)
Platelet Count: 253 (ref 150–400)
RBC: 4.26 — ABNORMAL LOW (ref 4.40–5.60)
RDW-CV: 12.9 (ref 11.6–14.4)
WBC: 9.63 (ref 4.30–10.00)

## 2013-05-22 LAB — 1ST EXTRA BLUE TOP

## 2013-05-22 LAB — CREATININE
Creatinine: 0.92 (ref 0.51–1.18)
GFR, Calc, African American: >60 (ref >59)
GFR, Calc, European American: >60 (ref >59)

## 2013-05-22 NOTE — Progress Notes (Addendum)
CC: s/p R cuboid, R navicular fx with R talonavicular dislocation s/p fall from 10-12 feet on 05/01/13.    Procedures:   05/02/13: R TN percutaneous reduction and pin fixation, closed tx navicular and cuboid fx    HPI:    Interval history: has been doing well.  Mostly NWB on the RLE.  Minimal pain.  No paresthesias.  Has been splinted since post-operatively.  Had onset of L calf pain approximately 1 week ago.      Pertinent ROS: denies fevers, chills, chest pain, SOB, nausea, vomiting, diarrhea, any other pain    Other injuries: none    Physical Exam:     well-appearing,  oriented to time, person and place.     MUSCULOSKELETAL:  Incision: well healed with no signs of infection  Tenderness: nontender to palpation throughout, minimal swelling  Gait: N/A, NWB   ROM: grossly intact, but limited due to recent immobilization  Motor strength: grossly intact EHL/FHL/TA/GS  Sensation: Grossly intact to light touch    Plain Radiographs: none  Other imaging studies not obtained today.    Impression: 3 weeks s/p above procedure doing well    Plan:   1.  Change to cast today, remove stitch  2.  NWB RLE x 3 more weeks  3.  Duplex LLE today.  Results per the vascular department.  If positive, will have him come back to the office today to coordinate care.  If negative, he can follow-up as below.  4.  Follow-up in 3 weeks with xrays    Addendum:    Spoke with vascular lab.  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 No deep vien thrombosis in the bilateral thigh, popliteal, posterior tibial or peroneal vein.    Spoke with medicine consult.  Recommended patient to go to ED for likely admission for anticoagulation.

## 2013-06-12 ENCOUNTER — Ambulatory Visit (HOSPITAL_BASED_OUTPATIENT_CLINIC_OR_DEPARTMENT_OTHER): Payer: 59 | Attending: Orthopaedic Surgery

## 2013-06-12 VITALS — BP 134/76 | HR 67 | Ht 68.0 in | Wt 213.0 lb

## 2013-06-12 DIAGNOSIS — Z5189 Encounter for other specified aftercare: Secondary | ICD-10-CM | POA: Insufficient documentation

## 2013-06-12 DIAGNOSIS — S93336A Other dislocation of unspecified foot, initial encounter: Secondary | ICD-10-CM | POA: Insufficient documentation

## 2013-06-12 DIAGNOSIS — S92213A Displaced fracture of cuboid bone of unspecified foot, initial encounter for closed fracture: Secondary | ICD-10-CM | POA: Insufficient documentation

## 2013-06-12 DIAGNOSIS — IMO0001 Reserved for inherently not codable concepts without codable children: Secondary | ICD-10-CM | POA: Insufficient documentation

## 2013-06-12 NOTE — Patient Instructions (Addendum)
Pins out today  Transition to a walking boot and slowly start weight bearingxr femur

## 2013-06-12 NOTE — Progress Notes (Signed)
Cristian Baker, SOKOLOWSKI          [id number]          06/12/2013      OR[facility]ED[facility] OUTPATIENT CLINIC NOTE       IDENTIFICATION/CHIEF COMPLAINT     Cristian Baker is a 52 year old male who is now almost six weeks status post percutaneous pinning, reduction, and pin fixation of his talonavicular dislocation performed on 05/02/2013.    INTERVAL HISTORY     He has been doing well in his cast.  He has been nonweightbearing.  He has been doing well.  He is currently staying at the [facility].  When he was seen last in clinic, he was found to have a blood clot and was transferred to [facility] for anticoagulation management.  He is now on warfarin, being followed by his PCP.  He has another appointment tomorrow for continued followup there.  He has had decreased pain in both his foot and his calf.  He has had no shortness of breath, no chest pain, no fevers, chills, nausea, vomiting, or any other systemic symptoms or concern for infection.  He is here with repeat x-rays and with cast removed by the cast techs.    PHYSICAL EXAMINATION     VITAL SIGNS:  Height 5 feet, 8 inches, 213 pounds, heart rate of 67, blood pressure 134/76.  GENERAL:  A well-appearing, obese African-American male in no acute distress.  Alert, oriented and comfortable, cooperative with exam.  RESPIRATORY:  Breathing comfortably on room air.  No respiratory distress.  No accessory muscle use.  MUSCULOSKELETAL:  Right lower extremity:  Cast removed.  Three pins are in place.  The pin sites are clean and dry.  There is no drainage, no redness, no cellulitis, no fluctuance.  Foot otherwise appears well with only minimal swelling.  He is minimally tender in the midfoot.  He is able to bring his foot up to neutral and demonstrates about 25 degrees of plantar flexion.  He demonstrates strength against resistance with EHL, FHL, and ankle dorsiflexion and plantar flexion.  He has a 2+ dorsalis pedis pulse, and his toes are all warm and  well-perfused.  His calf is nontender to palpation.    IMAGING     X-rays demonstrate maintained alignment of his talonavicular joints as well as pin placement, which demonstrate no migration or breakage.    ASSESSMENT     Cristian Baker is a 52 year old male, almost six weeks status post closed reduction and percutaneous pinning of his talonavicular dislocation, doing very well.    PLAN     Today, we will remove his pins, provide dressings, and transition him to a CAM walking boot, where he can begin to start to weightbear.  He was cautioned to do this with incrementally and not immediately start walking long distances.  He expresses understanding and will proceed carefully.  He was told to check his wounds, and if there are any signs of infection such as redness, drainage, fevers, chills, or other concern, he that should contact the clinic or come back to the Emergency Department.  He expresses understanding and is eager to move along with healing.  We will see him back in six more weeks for repeat x-rays and reevaluation.  He should continue his warfarin per his primary care provider for his DVT  treatment.     I did not see this patient, but have reviewed the findings above.

## 2013-06-12 NOTE — Progress Notes (Signed)
Note dictated

## 2013-06-13 NOTE — Progress Notes (Signed)
Per Md's order SLC was removed no signs of infection at pin site. Pt was then placed into a CAM walker boot. Pt tolerated procedure well.    Christena Deem  Registered Orthopedic Technologist  Department of [facility]  [place], [place]

## 2013-07-17 ENCOUNTER — Encounter (HOSPITAL_BASED_OUTPATIENT_CLINIC_OR_DEPARTMENT_OTHER): Payer: Self-pay

## 2013-07-17 ENCOUNTER — Ambulatory Visit (HOSPITAL_BASED_OUTPATIENT_CLINIC_OR_DEPARTMENT_OTHER): Payer: 59 | Attending: Orthopaedic Surgery

## 2013-07-17 VITALS — BP 127/77 | HR 80 | Resp 16 | Ht 68.0 in | Wt 213.0 lb

## 2013-07-17 DIAGNOSIS — Z5189 Encounter for other specified aftercare: Secondary | ICD-10-CM | POA: Insufficient documentation

## 2013-07-17 DIAGNOSIS — S93336A Other dislocation of unspecified foot, initial encounter: Secondary | ICD-10-CM | POA: Insufficient documentation

## 2013-07-17 NOTE — Patient Instructions (Signed)
Continue weightbearing as tolerated in the CAM boot.  Return to clinic in 6 weeks [facility]) for repeat foot xrays.

## 2013-07-17 NOTE — Progress Notes (Addendum)
See dictation please

## 2013-07-18 NOTE — Progress Notes (Signed)
Cristian Baker          [id number]          07/17/2013      IDENTIFICATION     Cristian Baker is a very pleasant 52 year old male who underwent a 05/02/2013 closed reduction and pinning of his talonavicular dislocation.    SUBJECTIVE     Cristian. Cristian Baker is a 52 year old male who sustained a ground-level fall resulting in a cuboid and navicular fracture as well as a right talonavicular joint disruption that was treated with 05/02/2013 percutaneous pinning of the talonavicular dislocation on 05/02/2013 with subsequent pin removal on 06/12/2013.  He has not been bearing weight on the foot, but states that his pain has drastically improved.  He still has the dressing from the pin removal on the foot. There is quite a bit of dry skin around this.  He is now on warfarin, being followed by his primary doctors.  He was also found to have had blood clot. He has not had fevers, chills, nausea, or vomiting.    PHYSICAL EXAMINATION     VITAL SIGNS:  Blood pressure 127/77, heart rate 80.  GENERAL:  He is a very friendly, conversant, obese male in no acute distress.  RESPIRATORY:  Breathing on room air with nonlabored breathing.  MUSCULOSKELETAL:  Focal examination of the right foot reveals that his percutaneous incisions from the pins on the dorsal and plantar foot are well healed without any erythema or drainage.  There is still Betadine and original dressing in place, that were removed today.  He has dry skin surrounding the foot.  He does not have focal tenderness.  He is able to demonstrate approximately 5 degrees of dorsiflexion and 10 degrees of plantar flexion and this is not cause of pain.  He fires EHL.    IMAGING     X-rays performed today show interval subluxation of the talonavicular joint that is most appreciated on the lateral view.    ASSESSMENT AND PLAN     We discussed with Cristian. Preusser that the talonavicular joint has re-subluxated. It is not clear why this happened.  He has not had any new trauma and  states that he has not been walking on the foot.  He does have comorbidities and is currently on Coumadin for recent blood clot.  For now, we will plan on giving him a period of 6 weeks of weightbearing as tolerated in the Cam boot.  We will see him back in 6 weeks' time for repeat x-rays. As it is likely that he would require talonavicular fusion if he has pain with this, we feel that a trial of weightbearing as tolerated in the Cam boot is appropriate.  Cristian. Stegall is agreeable with this plan and we will see him in mid January for repeat x-rays of the right foot, which I have ordered today.    This plan was discussed with Dr. Dillard Essex.

## 2013-07-24 NOTE — Progress Notes (Signed)
I did not see this patient, but have reviewed the findings above.

## 2013-08-28 ENCOUNTER — Ambulatory Visit (HOSPITAL_BASED_OUTPATIENT_CLINIC_OR_DEPARTMENT_OTHER): Payer: 59 | Attending: Orthopaedic Surgery | Admitting: Orthopaedic Surgery

## 2013-08-28 ENCOUNTER — Encounter (HOSPITAL_BASED_OUTPATIENT_CLINIC_OR_DEPARTMENT_OTHER): Payer: Self-pay

## 2013-08-28 VITALS — BP 124/80 | HR 92 | Resp 16 | Ht 68.0 in | Wt 213.0 lb

## 2013-08-28 DIAGNOSIS — S93304D Unspecified dislocation of right foot, subsequent encounter: Secondary | ICD-10-CM

## 2013-08-28 DIAGNOSIS — S93336A Other dislocation of unspecified foot, initial encounter: Secondary | ICD-10-CM

## 2013-08-28 DIAGNOSIS — Z5189 Encounter for other specified aftercare: Secondary | ICD-10-CM | POA: Insufficient documentation

## 2013-08-28 DIAGNOSIS — S93306A Unspecified dislocation of unspecified foot, initial encounter: Secondary | ICD-10-CM | POA: Insufficient documentation

## 2013-08-28 NOTE — Patient Instructions (Signed)
-  Follow-up with Dr. [facility]age at [facility]

## 2013-08-29 NOTE — Progress Notes (Signed)
Cristian Baker, Cristian Baker          [id number]          08/28/2013      CHIEF COMPLAINT     Right talonavicular fracture dislocation, status post closed reduction and pinning 05/02/2013.    HISTORY OF PRESENT ILLNESS     Cristian Baker is a 53 year old man who had a fall from approximately ten feet onto his bilateral feet and sustained a right talonavicular fracture dislocation as well as a cuboid fracture.  He was treated with a percutaneous reduction and fixation on 05/02/2013.  He subsequently had his pins removed on 06/12/2013.  He was last seen in clinic on 07/24/2013, at which point, he was noted to have on his imaging subluxation of his talonavicular joint.  He was made weightbearing as tolerated in a Cam boot.  He returns today for a 6-week followup.  Today he states that he continues to have pain in his right foot.  He states that he has been able to weightbear as tolerated though.  He has continued to wear his Cam boot.  He denies any new trauma.  He denies any recent fevers, chills, nausea, or vomiting.    PHYSICAL EXAMINATION     GENERAL:  Well-nourished, well-developed male sitting on exam table, in no acute distress.  RESPIRATORY:  Breathing comfortably on room air without evidence of wheezing or cyanosis  MUSCULOSKELETAL:  Examination of the right lower extremity demonstrates an obvious deformity of his right foot.  He has no evidence of skin at risk.  He has sensation intact to light touch in sural, saphenous, tibial, superficial, and deep peroneal nerve distributions.  He has a range of motion of approximately 10 degrees of dorsiflexion and 20 degrees of plantar flexion at the ankle.  He is able to demonstrate EHL and FHL with resisted strength.   His toes are warm and well perfused.    IMAGING STUDIES     Radiographs of the right foot demonstrate interval increase subluxation of his talonavicular joint.  Again, no acute fractures.    ASSESSMENT AND PLAN     Cristian Baker is a 53 year old man who had a fall  from approximately 10 feet and sustained a right talonavicular fracture dislocation with associated cuboid fracture.  He underwent percutaneous reduction and pinning on 05/02/2013 and subsequently had the pins removed on 06/12/2013.  Unfortunately, on his imaging today, he has demonstrated further subluxation of his talonavicular joint.  We are unsure as to exactly why this happened.  He has had no trauma and obeyed his weightbearing status.  His likely treatment given that he continues to have pain and further subluxation in the interim since his prior radiographs, he may very well require talonavicular arthrodesis.  This was discussed with the patient and he is not excited about the idea of having additional surgery.  However, he states that he wants to do what needs to be done for him to be as mobile and active as possible.    We will [facility] to see Dr. Griffin BasilBrage for further evaluation and management plans for subluxation of his talonavicular joint    This patient was seen and discussed with Dr. Sabino Donovanavid Barei, who agrees with the assessment and plan as documented above.

## 2013-09-04 ENCOUNTER — Telehealth (HOSPITAL_BASED_OUTPATIENT_CLINIC_OR_DEPARTMENT_OTHER): Payer: Self-pay | Admitting: Orthopedic Surgery

## 2013-09-04 NOTE — Progress Notes (Signed)
I did not see this patient, but have reviewed the findings above.

## 2013-09-04 NOTE — Telephone Encounter (Signed)
CONFIRMED PHONE NUMBER: [phone]  CALLERS FIRST AND LAST NAME: Cristian BundeMichael Eugene Baker  FACILITY NAME: na TITLE: na  CALLERS RELATIONSHIP:Self  RETURN CALL: Detailed message on voicemail only     SUBJECT: General Message   REASON FOR REQUEST: Scheduled patient for 1/21 at 8:30, patient will arrive a 8am to fill out new patient paperwork    MESSAGE: Sending message to notify clinic of next day appointment per SOP.

## 2013-09-05 ENCOUNTER — Ambulatory Visit (HOSPITAL_BASED_OUTPATIENT_CLINIC_OR_DEPARTMENT_OTHER): Payer: 59 | Attending: Orthopedic Surgery | Admitting: Orthopedic Surgery

## 2013-09-05 ENCOUNTER — Encounter (HOSPITAL_BASED_OUTPATIENT_CLINIC_OR_DEPARTMENT_OTHER): Payer: Self-pay | Admitting: Orthopedic Surgery

## 2013-09-05 VITALS — BP 146/93 | HR 74 | Resp 16 | Ht 68.0 in | Wt 213.0 lb

## 2013-09-05 DIAGNOSIS — IMO0001 Reserved for inherently not codable concepts without codable children: Secondary | ICD-10-CM | POA: Insufficient documentation

## 2013-09-05 DIAGNOSIS — S8290XS Unspecified fracture of unspecified lower leg, sequela: Secondary | ICD-10-CM | POA: Insufficient documentation

## 2013-09-05 DIAGNOSIS — I82409 Acute embolism and thrombosis of unspecified deep veins of unspecified lower extremity: Secondary | ICD-10-CM | POA: Insufficient documentation

## 2013-09-05 DIAGNOSIS — S92251S Displaced fracture of navicular [scaphoid] of right foot, sequela: Secondary | ICD-10-CM

## 2013-09-05 DIAGNOSIS — M19079 Primary osteoarthritis, unspecified ankle and foot: Secondary | ICD-10-CM | POA: Insufficient documentation

## 2013-09-05 NOTE — Progress Notes (Signed)
REASON FOR VISIT:    Chief Complaint   Patient presents with   . Follow Up Visit     S/P percutaneous fixation R talovavicular fracture and closed treatment R cuboid fracture and R navicular fracture 05/02/13.       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  He presents with a chief complaint of pain and known injury to the  right foot that has been ongoing for 3 months.. Patient sustained an injury to the leg when falling about 10 feet, on April 30, 2013. He was seen and evaluated at [facility], diagnosed with a right talonavicular fracture dislocation and cuboid fracture (Chopart fracture dislocation), and underwent percutaneous pinning with Dr Dillard Essex. After pin removal he has lost reduction of the talonavicular joint that has worsened, and so was referred for further evaluation.    Also, his course was complicated by DVT, for which he is on warfarin    Currently he is wearing a CAM boot and using a walker, and staying at [facility].     He currently rates his pain as 1 out of 10 that is Aching and Throbbing. Pain is aggravated by weight bearing.  It is alleviated by limiting activity and elevation. The pain is worsening. Treatments that have been attempted have included the prior pinning, and using the CAM boot.    ASSESSMENT AND PLAN:    Cristian Baker is a 53 year old male who complains of pain and deformity following a Chopart fracture dislocation, with loss of reduction after pinning. He has a dislocated talonavicular joint and a shortened lateral column. We discussed that, at this point, we would recommend a talonavicular fusion and CC fusion, possibly with graft to restore length. This will limit motion and could make walking over uneven ground more difficult. However, it would restore a plantargrade foot and be the way, at this point, to improve his function. We talked about risks, benefits,  alternatives. He wishes to proceed to surgery, and we started the process.    He will need to transition off warfarin preop, to subq LMWH if he needs ongoing anticoagulation - this could be given the night before surgery, and held the day of.    We had a good session.  We answered the patient's questions, addressed their concerns, reviewed their x-rays, and had ample time to ask questions and will follow up for preop.    Patient seen, examined, discussed with Dr Griffin Basil.    Best regards,   Mliss Sax, MD, R5  [facility]  Department of Orthopaedics and Sports Medicine      PHYSICAL EXAM    Filed Vitals:    09/05/13 0823   BP: 146/93   Pulse: 74   Resp: 16   Height: 5\' 8"  (1.727 m)   Weight: 213 lb (96.616 kg)       Skin:    Swelling, and overall with a flattened midfoot and planovalgus. No open wounds  Comments-  Pulses:   Toes all warm and well perfused, palpable pedal pulse, compartments soft,  Moderate swelling  Comments-  Sensation to light touch:   Superficial Peroneal Distribution- normal  Deep Peroneal Distribution - normal  Sural Distribution - normal  Saphenous Distribution -normal  Tibial Nerve Distribution - normal  Medial Plantar Distribution - normal  Lateral Plantar Distribution - normal   No plantar fascial  pain    Comments-   Strength:   Ankle Dorsiflexion, 4/5  Ankle Plantarflexion, 4/5  Inversion, 4/5  Eversion, 4/5  Great Toe Dorsiflexion, 4/5  Comments-   Motion:   Ankle - full range of motion    Subtalar - diminished range of motion   1st MTP - diminished range of motion    1st TMT- diminished range of motion   2nd-5th TMT/MTP - full range of motion     Comment -   Standing Alignment;    Knee: neutral   Foot/Ankle:  valgus   Comments -     IMAGING:    AP, lateral, oblique weight bearing view of the Right  foot.   Ankle mortise is intact. Talonavicular dislocation with collapse, and shortened lateral column. Navicular/cuboid fracture.    Allergies:    Review of  patient's allergies indicates no known allergies.    Past Surgical History:   Past Surgical History   Procedure Laterality Date   . Unlisted procedure foot/toes         Past Medical History:  Past Medical History   Diagnosis Date   . Disorder of muscle, ligament, and fascia        Family History:  Family History   Problem Relation Age of Onset   . Hypertension Mother    . Lupus Sister    . Heart Attack Maternal Grandfather         Medications:  Current Outpatient Prescriptions   Medication Sig Dispense Refill   . Aspirin 81 MG Oral Tab EC Take 1 tablet by mouth daily  30 Tab  3   . Ergocalciferol 50000 UNIT Oral Cap take 1 capsule every Friday for the next 8 weeks  8 Cap  0   . Ondansetron 4 MG Oral TABLET DISPERSIBLE None Entered       . Simvastatin 20 MG Oral Tab Take 2 tablet by mouth every evening  30 Tab  3   . Warfarin Sodium 7.5 MG Oral Tab Take 12.5 mg by mouth every other day.          No current facility-administered medications for this visit.        Social History:   History     Social History   . Marital Status: Single     Spouse Name: N/A     Number of Children: N/A   . Years of Education: N/A     Occupational History   . Not on file.     Social History Main Topics   . Smoking status: Former Games developermoker   . Smokeless tobacco: Never Used   . Alcohol Use: No   . Drug Use: No   . Sexual Activity: Not on file     Other Topics Concern   . Not on file     Social History Narrative     The patient states his problem is not work related.   Employment - is unemployed since about 2 years ago.    History   Drug Use No     History   Alcohol Use No     History   Smoking status   . Former Smoker   Smokeless tobacco   . Never Used       Review of Systems  Patient denied any of the following unless otherwise indicated with a preceding (+)  General: __Fever __Weight Loss __Extreme Fatigue    EYES: __Double Vision__Sudden Loss of Vision  HEENT:__Sore Throat__Runny Nose__Ear Pain  CHEST:__Chest Pain __Palpitations    LUNGS:__Cough__Sneezing__Short of Breath  GI:__Nausea__Vomiting__Abdominal Pain__Constipation__Diarrhea  GU:__Frequent or painful Urination__Blood in urine__ Oral Contraceptive  SKIN: __Skin Rash  NEURO:__Headache__Persistent Numbness__Falling  MUSC:__Joint Pain__Muscle Weakness  PSYCH:__Depression__Anxiety__Suicidal  ENDO: __Excessive Thirst__Cold or Heat Intolerance   HEME:__Unusual bruising or bleeding__Enlarged Lymph Nodes      -------------------------------------------  Attending: Burna Sis, MD  I saw and evaluated the patient and agree with Dr. Ronne Binning note.   Additional diagnoses: none    Date: 09/06/2013    Time: 5:50 AM    ----------------------------------------

## 2013-09-05 NOTE — Patient Instructions (Signed)
Thanks for seeing us today

## 2013-10-12 ENCOUNTER — Other Ambulatory Visit (HOSPITAL_BASED_OUTPATIENT_CLINIC_OR_DEPARTMENT_OTHER): Payer: Self-pay

## 2013-10-12 ENCOUNTER — Ambulatory Visit (HOSPITAL_BASED_OUTPATIENT_CLINIC_OR_DEPARTMENT_OTHER): Payer: 59 | Attending: Adult Health

## 2013-10-12 ENCOUNTER — Encounter (HOSPITAL_BASED_OUTPATIENT_CLINIC_OR_DEPARTMENT_OTHER): Payer: Self-pay | Admitting: Adult Health

## 2013-10-12 ENCOUNTER — Ambulatory Visit (HOSPITAL_BASED_OUTPATIENT_CLINIC_OR_DEPARTMENT_OTHER): Payer: 59 | Admitting: Adult Health

## 2013-10-12 VITALS — BP 141/87 | HR 72 | Resp 18 | Ht 68.0 in | Wt 215.0 lb

## 2013-10-12 DIAGNOSIS — Z01818 Encounter for other preprocedural examination: Secondary | ICD-10-CM | POA: Insufficient documentation

## 2013-10-12 DIAGNOSIS — S93304D Unspecified dislocation of right foot, subsequent encounter: Secondary | ICD-10-CM

## 2013-10-12 DIAGNOSIS — Z5189 Encounter for other specified aftercare: Secondary | ICD-10-CM

## 2013-10-12 NOTE — Progress Notes (Signed)
REASON FOR VISIT:    Operative consenting for a Right talonavicular, calcaneal/cuboid fusion using proximal tibial bone graft and gastrox slide to be performed by Dr. Griffin BasilBrage on November 01, 2013.    ASSESSMENT AND PLAN:    Operative consenting for a Right talonavicular, calcaneal/cuboid fusion using proximal tibial bone graft and gastrox slide to be performed by Dr. Griffin BasilBrage on November 01, 2013.  Patient signed their consent and I witnessed the patient's signature.  We discussed risk factors that included but not limited to infection, bleeding, nerve damage, non-unions, and possible bone fractures during implantation, poor wound healing, and blood clots.  I demonstrated the proper technique for elevation.  We discussed non weight bearing for the first 6 week and progressive weight-bearing for the second 6 weeks.  The patient verbalized their understanding of the restrictions and risk factors.  The patient also understands that there will be a 2 week visit for wound check and probable suture removal.  At that time the patient will go into a boot or cast depending on intraoperative findings.  I answered the patient's questions, addressed their concerns, and reviewed x-rays.  The patient had ample time to ask questions of myself/physician.  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 Patient sustained an injury to the leg when falling about 10 feet, on April 30, 2013. He was seen and evaluated at [facility], diagnosed with a right talonavicular fracture dislocation and cuboid fracture (Chopart fracture dislocation), and underwent percutaneous pinning with Dr Dillard EssexBarei. After pin removal he has lost reduction of  the talonavicular joint that has worsened, and so was referred for further evaluation.    Also, his course was complicated by DVT, for which he is on warfarin  PHYSICAL EXAM    Today's pain scale 0/10    Filed Vitals:    10/12/13 0921   BP: 141/87   Pulse: 72   Resp: 18   Height: 5\' 8"  (1.727 m)   Weight: 215 lb (97.523 kg)       Skin:    No swelling, warm and dry, generalized red and scaly with reported discharge circumfrentially form the ankle proximally to mid tibia but does not interfere with the surgical site  Comments-  Pulses:   Toes all warm and well perfused, palpable pedal pulse, compartments soft  Comments-  Sensation to light touch:   Superficial Peroneal Distribution- normal  Deep Peroneal Distribution - normal  Sural Distribution - normal  Saphenous Distribution -normal  Tibial Nerve Distribution - normal  Medial Plantar Distribution - normal  Lateral Plantar Distribution - normal   No plantar fascial pain    Comments-   Strength:   Ankle Dorsiflexion, 3/5  Ankle Plantarflexion, 3/5  Inversion, 3/5  Eversion, 3/5  Great Toe Dorsiflexion, 4/5  Comments-   Motion:   Ankle - diminished range of motion    Subtalar - diminished range of motion   1st MTP - diminished range of motion    1st TMT- diminished range of motion   2nd-5th TMT/MTP - diminished range of motion      Review of Systems  Patient  denied any changes   Past Medical History   Diagnosis Date   . Disorder of muscle, ligament, and fascia      Past Surgical History   Procedure Laterality Date   . Unlisted procedure foot/toes       Family History   Problem Relation Age of Onset   . Hypertension Mother    . Lupus Sister    . Heart Attack Maternal Grandfather      Current Outpatient Prescriptions   Medication Sig Dispense Refill   . Aspirin 81 MG Oral Tab EC Take 1 tablet by mouth daily  30 Tab  3   . Ergocalciferol 50000 UNIT Oral Cap take 1 capsule every Friday for the next 8 weeks  8 Cap  0   . Ondansetron 4 MG Oral TABLET DISPERSIBLE None  Entered       . Simvastatin 20 MG Oral Tab Take 2 tablet by mouth every evening  30 Tab  3   . Warfarin Sodium 7.5 MG Oral Tab Take 12.5 mg by mouth every other day.          No current facility-administered medications for this visit.     History     Social History   . Marital Status: Single     Spouse Name: N/A     Number of Children: N/A   . Years of Education: N/A     Occupational History   . Not on file.     Social History Main Topics   . Smoking status: Former Games developer   . Smokeless tobacco: Never Used   . Alcohol Use: No   . Drug Use: No   . Sexual Activity: Not on file     Other Topics Concern   . Not on file     Social History Narrative     History   Drug Use No     History   Alcohol Use No     Review of patient's allergies indicates no known allergies.      Francisca December, NP-BC  Orthopedics Foot and Ankle

## 2013-10-12 NOTE — Patient Instructions (Signed)
Operative consenting for a Right talonavicular, calcaneal/cuboid fusion using proximal tibial bone graft and gastrox slide to be performed by Dr. Griffin BasilBrage on November 01, 2013.  Patient signed their consent and I witnessed the patient's signature.  We discussed risk factors that included but not limited to infection, bleeding, nerve damage, non-unions, and possible bone fractures during implantation, poor wound healing, and blood clots.  I demonstrated the proper technique for elevation.  We discussed non weight bearing for the first 6 week and progressive weight-bearing for the second 6 weeks.  The patient verbalized their understanding of the restrictions and risk factors.  The patient also understands that there will be a 2 week visit for wound check and probable suture removal.  At that time the patient will go into a boot or cast depending on intraoperative findings.  I answered the patient's questions, addressed their concerns, and reviewed x-rays.  The patient had ample time to ask questions of myself/physician.  He will see his PC and in addition to his pre op screening he will have a Vit D level and begin supplemental Vit d

## 2013-10-12 NOTE — Progress Notes (Signed)
ASSESSMENT:    Primary Learner patient  Patient Challenges to Learning none   Challenges Impacting this Teaching Session none  Interpreter used     POST-OP RECOMMENDED EQUIPMENT:  Bilat procedure: no  Mobility Devices: already has them  Bathroom Equipment: no    PRE-OP EDUCATION:  Pre-surgery Instructions:   Bring equipment to hospital  Cleanse operative site with 2% chlorohexidine wipes    Discharge and Recovery Instructions: Call clinic at [phone] should you have any questions/concerns.  Call ortho pharmacy at [phone] for any medication questions. (i.e. Side effects, ineffective pain management, refill)     Post-op Appointment: given to patient    EDUCATION:    One-on-one teaching with patient  Foot and Ankle Handbook provided to pt   Post education response patient states he understands instructions  Time spent teaching: 6mins

## 2013-10-13 LAB — R/O MRSA

## 2013-11-01 ENCOUNTER — Inpatient Hospital Stay (HOSPITAL_COMMUNITY): Payer: 59 | Admitting: Orthopedic Surgery

## 2013-11-01 ENCOUNTER — Inpatient Hospital Stay (HOSPITAL_BASED_OUTPATIENT_CLINIC_OR_DEPARTMENT_OTHER)
Admission: RE | Admit: 2013-11-01 | Discharge: 2013-11-03 | DRG: 314 | Disposition: A | Discharge status: Home / Self Care | Payer: 59 | Attending: Orthopaedic Trauma | Admitting: Orthopaedic Trauma

## 2013-11-01 DIAGNOSIS — Z7982 Long term (current) use of aspirin: Secondary | ICD-10-CM

## 2013-11-01 DIAGNOSIS — Y92009 Unspecified place in unspecified non-institutional (private) residence as the place of occurrence of the external cause: Secondary | ICD-10-CM

## 2013-11-01 DIAGNOSIS — Y831 Surgical operation with implant of artificial internal device as the cause of abnormal reaction of the patient, or of later complication, without mention of misadventure at the time of the procedure: Secondary | ICD-10-CM | POA: Diagnosis present

## 2013-11-01 DIAGNOSIS — E669 Obesity, unspecified: Secondary | ICD-10-CM | POA: Diagnosis present

## 2013-11-01 DIAGNOSIS — Z86718 Personal history of other venous thrombosis and embolism: Secondary | ICD-10-CM

## 2013-11-01 DIAGNOSIS — M24476 Recurrent dislocation, unspecified foot: Secondary | ICD-10-CM | POA: Diagnosis present

## 2013-11-01 DIAGNOSIS — S8290XS Unspecified fracture of unspecified lower leg, sequela: Secondary | ICD-10-CM

## 2013-11-01 DIAGNOSIS — G8918 Other acute postprocedural pain: Secondary | ICD-10-CM

## 2013-11-01 DIAGNOSIS — Z7901 Long term (current) use of anticoagulants: Secondary | ICD-10-CM

## 2013-11-01 DIAGNOSIS — M12579 Traumatic arthropathy, unspecified ankle and foot: Secondary | ICD-10-CM

## 2013-11-01 DIAGNOSIS — Z87891 Personal history of nicotine dependence: Secondary | ICD-10-CM

## 2013-11-01 DIAGNOSIS — IMO0002 Reserved for concepts with insufficient information to code with codable children: Secondary | ICD-10-CM

## 2013-11-01 DIAGNOSIS — T84498A Other mechanical complication of other internal orthopedic devices, implants and grafts, initial encounter: Principal | ICD-10-CM | POA: Diagnosis present

## 2013-11-01 DIAGNOSIS — M6240 Contracture of muscle, unspecified site: Secondary | ICD-10-CM

## 2013-11-01 DIAGNOSIS — Z6832 Body mass index (BMI) 32.0-32.9, adult: Secondary | ICD-10-CM

## 2013-11-01 DIAGNOSIS — M24473 Recurrent dislocation, unspecified ankle: Secondary | ICD-10-CM | POA: Diagnosis present

## 2013-11-02 ENCOUNTER — Other Ambulatory Visit (HOSPITAL_COMMUNITY): Payer: Self-pay | Admitting: Orthopaedic Surgery

## 2013-11-02 LAB — BASIC METABOLIC PANEL
Anion Gap: 7 (ref 4–12)
Calcium: 9.2 mg/dL (ref 8.9–10.2)
Carbon Dioxide, Total: 29 mEq/L (ref 22–32)
Chloride: 102 mEq/L (ref 98–108)
Creatinine: 0.85 mg/dL (ref 0.51–1.18)
GFR, Calc, African American: >60 mL/min (ref >59)
GFR, Calc, European American: >60 mL/min (ref >59)
Glucose: 96 mg/dL (ref 62–125)
Potassium: 4.1 mEq/L (ref 3.6–5.2)
Sodium: 138 mEq/L (ref 135–145)
Urea Nitrogen: 10 mg/dL (ref 8–21)

## 2013-11-02 LAB — CBC (HEMOGRAM)
Hematocrit: 43 % (ref 38–50)
Hemoglobin: 14.2 g/dL (ref 13.0–18.0)
MCH: 30.8 pg (ref 27.3–33.6)
MCHC: 32.7 g/dL (ref 32.2–36.5)
MCV: 94 fL (ref 81–98)
Platelet Count: 146 10*3/uL — ABNORMAL LOW (ref 150–400)
RBC: 4.61 mil/uL (ref 4.40–5.60)
RDW-CV: 14.1 % (ref 11.6–14.4)
WBC: 11.09 10*3/uL — ABNORMAL HIGH (ref 4.30–10.00)

## 2013-11-03 ENCOUNTER — Other Ambulatory Visit (HOSPITAL_COMMUNITY): Payer: Self-pay | Admitting: Orthopaedic Surgery

## 2013-11-03 LAB — CBC (HEMOGRAM)
Hematocrit: 39 % (ref 38–50)
Hemoglobin: 13 g/dL (ref 13.0–18.0)
MCH: 31 pg (ref 27.3–33.6)
MCHC: 33.1 g/dL (ref 32.2–36.5)
MCV: 94 fL (ref 81–98)
Platelet Count: 157 10*3/uL (ref 150–400)
RBC: 4.2 mil/uL — ABNORMAL LOW (ref 4.40–5.60)
RDW-CV: 13.9 % (ref 11.6–14.4)
WBC: 12.44 10*3/uL — ABNORMAL HIGH (ref 4.30–10.00)

## 2013-11-03 LAB — BASIC METABOLIC PANEL
Anion Gap: 6 (ref 4–12)
Calcium: 9.1 mg/dL (ref 8.9–10.2)
Carbon Dioxide, Total: 28 mEq/L (ref 22–32)
Chloride: 101 mEq/L (ref 98–108)
Creatinine: 0.87 mg/dL (ref 0.51–1.18)
GFR, Calc, African American: >60 mL/min (ref >59)
GFR, Calc, European American: >60 mL/min (ref >59)
Glucose: 88 mg/dL (ref 62–125)
Potassium: 3.6 mEq/L (ref 3.6–5.2)
Sodium: 135 mEq/L (ref 135–145)
Urea Nitrogen: 8 mg/dL (ref 8–21)

## 2013-11-16 ENCOUNTER — Ambulatory Visit (HOSPITAL_BASED_OUTPATIENT_CLINIC_OR_DEPARTMENT_OTHER): Payer: 59 | Attending: Adult Health | Admitting: Adult Health

## 2013-11-16 VITALS — BP 126/84 | Ht 68.0 in | Wt 215.0 lb

## 2013-11-16 DIAGNOSIS — M19079 Primary osteoarthritis, unspecified ankle and foot: Secondary | ICD-10-CM | POA: Insufficient documentation

## 2013-11-16 NOTE — Patient Instructions (Signed)
During your two week post op recovery period, elevation is one of the most important things you can do.  Elevating helps control swelling, speeds healing, and helps reduce pain.      Your Right foot may feel as if it is swelling and toes may turn red, so elevate often.  When elevating, make sure your foot is elevated higher than your knee and your knee is elevated higher than your hip; remember fluid runs downhill.  If you have an ace wrap on please remove it before going to bed.    Monitor your incision/s, if they appear to open, become red, painful, or increased drainage, stop your range of motion exercises and contact me via e-mail.  You may bathe your foot as directed as long as your incision/s remain clean, dry, and closed.  The steri-strips may come off and that is expected.     You may perform gentle active Right ankle range of motion exercises but continue to be non-weight bearing as instructed.  I will see you in approximately 1 weeks here at [facility] for wound check.     1) Precautions- continue non-weight bearing  2) Medications- continue current  3) Orthotics- CAM boot    4) Physical Therapy- range of motion for foot and ankle only   5) Follow Up - The patient will follow up in 1 weeks for wound check      If you have any questions or concerns, please e-mail me at;  [email or web address]      Francisca DecemberAndrew D Wadell Craddock, NP-BC  Orthopedics Foot and Ankle

## 2013-11-16 NOTE — Progress Notes (Signed)
Per Provider orders sutures was removed from gastroc and anterior area, steri strips was applied and a large camboot. Sutures was left intact at lateral and medial ankle area. Patient tolerated the procedure well.  Instructions was given on care. Patient stated understanding of care.  It took about 35 minutes to remove, apply and instruct. Patient will follow up with Brage in one week for wound check for suture removal.

## 2013-11-16 NOTE — Progress Notes (Signed)
Result Type: Operative Report  Service Date: 01 November 2013 0:00      PREOPERATIVE DIAGNOSIS:    Midfoot transverse tarsal joint fracture-dislocation with posttraumatic arthritis and a tight gastrocnemius.    POSTOPERATIVE DIAGNOSIS:    Midfoot transverse tarsal joint fracture-dislocation with posttraumatic arthritis and a tight gastrocnemius.    PROCEDURES PERFORMED:    1. Open treatment of talotarsal joint dislocation.  2. Midtarsal arthrodesis, multiple joints (fusion of the talonavicular and calcaneocuboid joints)  3. Gastrocnemius recession obtained through a separate incision.    HISTORY:    Cristian Baker is [age].  He sustained a severe injury to his foot in an accident last year and had it percutaneously pinned by the Trauma Service here.  It was an anatomic reduction, but it fell apart.  He was in pain and was unable to function.  He was seen in the [facility] and we recommended an open reduction of his midfoot and fusing it in the correct position with a gastroc recession.  The risks and complications were discussed.  He signs consent freely.    OPERATIVE FINDINGS:    Findings Include a complete talonavicular joint dislocation. After significant debridement of both the calcaneocuboid and talonavicular joints, we were able  to get the TN joint reduced.  We held it with K-wires from the 5.5 screw set, plated it, and then we plated the CC joint after bone grafting both. It went well and in the end, he had a nicely positioned foot.    REASON FOR VISIT:    Cristian Baker is a 53 year old male is 2 weeks post op from the above stated procedure.    ASSESSMENT AND PLAN:    Cristian BundeMichael Eugene Baker is 2 weeks postop from the above stated procedure.  The patient will have their sutures removed from the gastroc recession site and the dorsal foot only the remaining incisions on the medial and lateral foot will remain in place for one additional week, steri-strips placed over the incisions, and ace wrap  for edema control and a CAM boot for maintenance for alignment and protection of the surgical repair.  May bathe foot and ankle when incision/s is/are clean and dry.  We had a good session.  I answered the patient's questions, addressed concerns and ad ample time to ask questions of myself/physician.  The patient will follow up in 1 weeks for wound check    PHYSICAL EXAM    Today's pain scale 2/10     There were no vitals filed for this visit.    Skin:    Incision is C/D/I/, with the exception of medial an lateral incision which are moist,  S/S of infection,Moderate resolving swelling with interdigit wrinkling noted, warm and dry       Comments-  Pulses:   Toes all warm and well perfused, palpable pedal pulse, compartments soft  Comments-  Sensation to light touch:   Superficial Peroneal Distribution- diminished  Deep Peroneal Distribution - diminished  Sural Distribution - diminished  Saphenous Distribution -diminished  Tibial Nerve Distribution - diminished  Medial Plantar Distribution - diminished  Lateral Plantar Distribution - diminished   No plantar fascial pain    Comments-    Strength:   Ankle Dorsiflexion, 4/5   Ankle Plantarflexion, 4/5   Inversion, 4/5   Eversion, 4/5   Great Toe Dorsi/plantarflexion, 4/5   Comments-   Motion:   Ankle - diminished range of motion    Subtalar - diminished range of  motion    1st MTP - diminished range of motion     1st TMT- diminished range of motion    2nd-5th TMT/MTP - diminished range of motion    Comment -     Past Medical History   Diagnosis Date   . Disorder of muscle, ligament, and fascia      Past Surgical History   Procedure Laterality Date   . Unlisted procedure foot/toes       Family History   Problem Relation Age of Onset   . Hypertension Mother    . Lupus Sister    . Heart Attack Maternal Grandfather      Current Outpatient Prescriptions   Medication Sig Dispense Refill   . Aspirin 81 MG Oral Tab EC Take 1 tablet by mouth daily  30 Tab  3   . Ergocalciferol  50000 UNIT Oral Cap take 1 capsule every Friday for the next 8 weeks  8 Cap  0   . Ondansetron 4 MG Oral TABLET DISPERSIBLE None Entered       . Simvastatin 20 MG Oral Tab Take 2 tablet by mouth every evening  30 Tab  3   . Warfarin Sodium 7.5 MG Oral Tab Take 12.5 mg by mouth every other day.          No current facility-administered medications for this visit.     History     Social History   . Marital Status: Single     Spouse Name: N/A     Number of Children: N/A   . Years of Education: N/A     Occupational History   . Not on file.     Social History Main Topics   . Smoking status: Former Games developer   . Smokeless tobacco: Never Used   . Alcohol Use: No   . Drug Use: No   . Sexual Activity: Not on file     Other Topics Concern   . Not on file     Social History Narrative     History   Drug Use No     History   Alcohol Use No     Review of patient's allergies indicates no known allergies.    Cristian December, NP-BC  [facility]

## 2013-11-22 ENCOUNTER — Other Ambulatory Visit (HOSPITAL_BASED_OUTPATIENT_CLINIC_OR_DEPARTMENT_OTHER): Payer: Self-pay | Admitting: Orthopedic Surgery

## 2013-11-22 DIAGNOSIS — M79671 Pain in right foot: Secondary | ICD-10-CM

## 2013-11-23 ENCOUNTER — Ambulatory Visit (HOSPITAL_BASED_OUTPATIENT_CLINIC_OR_DEPARTMENT_OTHER): Payer: 59 | Attending: Orthopedic Surgery | Admitting: Orthopedic Surgery

## 2013-11-23 DIAGNOSIS — M19079 Primary osteoarthritis, unspecified ankle and foot: Secondary | ICD-10-CM

## 2013-11-23 DIAGNOSIS — M79609 Pain in unspecified limb: Secondary | ICD-10-CM | POA: Insufficient documentation

## 2013-11-23 DIAGNOSIS — Z4789 Encounter for other orthopedic aftercare: Secondary | ICD-10-CM | POA: Insufficient documentation

## 2013-11-23 DIAGNOSIS — M79671 Pain in right foot: Secondary | ICD-10-CM

## 2013-11-23 NOTE — Progress Notes (Signed)
Per Md's order pt's sutures were removed from right foot and wet to dry dressing was applied to wounds. Gave instructions on how to do wet to dry dressings.  Pt tolerated procedure well. Pt was given care instructions at that time. Pt was advise to call back if any questions or concerns.        Venancio PoissonAaron, MA, [facility]  Orthopaedic [facility]  [facility], [phone]  [place]

## 2013-11-23 NOTE — Progress Notes (Signed)
REASON FOR VISIT:    Cristian Baker is here for follow-up wound care check sp right ORIF for a open talotarsal dislocation, midtarsal arthrodesis and gastroc recession performed on 11/01/13    HISTORY OF PRESENT ILLNESS:    Cristian BundeMichael Eugene Baker is a 53 year old male s/p the above stated procedure. He was seen in clinic last week and there was questionable healing around his lateral and medial incision, some remaining sutures were left in place. He comes in today for a follow up wound care check. He feels like it is improving but is still having some drainage from the area.      PHYSICAL EXAM    Today's pain scale 0/10   Filed Vitals:    11/23/13 1007   BP: 128/88   Pulse: 94   Height: 5\' 8"  (1.727 m)   Weight: 215 lb (97.523 kg)       Skin:  Incision is C/D/I/, no S/S of infection,        Pulses:   Toes all warm and well perfused, palpable pedal pulse, compartments soft      Sensation to light touch:   Superficial Peroneal Distribution- diminished  Deep Peroneal Distribution - diminished  Sural Distribution - diminished  Saphenous Distribution -diminished  Tibial Nerve Distribution - diminished  Medial Plantar Distribution - diminished  Lateral Plantar Distribution - diminished         Strength:   Ankle Dorsiflexion, 4/5  Ankle Plantarflexion, 4/5  Inversion, 4/5  Eversion, 4/5  Great Toe Dorsiflexion, 4/5    Motion:   Ankle - diminished range of motion    Subtalar - diminished range of motion   1st MTP - diminished range of motion    1st TMT- diminished range of motion       Imaging; AP/Lat/Obl of right foot show in place hardware with no signs of failure.      ASSESSMENT AND PLAN:    Cristian Baker is 6 weeks postop from the above stated procedure.  His wounds are healing nicely. We will remove his remaining sutures today. He should continue with wound care. We will check him back in 3 weeks.  I answered the patient's questions, addressed their concerns, and had ample time to ask questions.  The  patient will follow up in approximately  3 weeks for a wound check.    Past Surgical History   Procedure Laterality Date   . Unlisted procedure foot/toes         Past Medical History   Diagnosis Date   . Disorder of muscle, ligament, and fascia        Current Outpatient Prescriptions   Medication Sig Dispense Refill   . Aspirin 81 MG Oral Tab EC Take 1 tablet by mouth daily  30 Tab  3   . Ergocalciferol 50000 UNIT Oral Cap take 1 capsule every Friday for the next 8 weeks  8 Cap  0   . Ondansetron 4 MG Oral TABLET DISPERSIBLE None Entered       . OxyCODONE HCl 5 MG Oral Cap Take 5 mg by mouth daily.       . Simvastatin 20 MG Oral Tab Take 2 tablet by mouth every evening  30 Tab  3   . Warfarin Sodium 7.5 MG Oral Tab Take 12.5 mg by mouth every other day.          No current facility-administered medications for this visit.       Review  of patient's allergies indicates:  No Known Allergies    History     Social History   . Marital Status: Single     Spouse Name: N/A     Number of Children: N/A   . Years of Education: N/A     Occupational History   . Not on file.     Social History Main Topics   . Smoking status: Former Games developer   . Smokeless tobacco: Never Used   . Alcohol Use: No   . Drug Use: No   . Sexual Activity: Not on file     Other Topics Concern   . Not on file     Social History Narrative     Veatrice Bourbon, MD  [facility]      -------------------------------------------  Attending: Burna Sis, MD  I saw and evaluated the patient and agree with Dr. Raliegh Ip note.   Additional diagnoses: none    Date: 11/23/2013    Time: 12:20 PM    ----------------------------------------

## 2013-11-23 NOTE — Patient Instructions (Signed)
Thanks for seeing us today

## 2013-12-10 ENCOUNTER — Telehealth (HOSPITAL_BASED_OUTPATIENT_CLINIC_OR_DEPARTMENT_OTHER): Payer: Self-pay | Admitting: Orthopedic Surgery

## 2013-12-10 NOTE — Telephone Encounter (Signed)
CONFIRMED PHONE NUMBER: [phone]  CALLERS FIRST AND LAST NAME: Guss BundeMichael Eugene Cerro  FACILITY NAME: na TITLE: na  CALLERS RELATIONSHIP:Self  RETURN CALL: Not OK to leave any message, this number is to the shelter so there is no VM     SUBJECT: General Message   REASON FOR REQUEST: Medical Supplies     MESSAGE: Patient is calling because he is running out of supplies like bandages and such and wants to come by and get more. He wants to come in tomorrow, 4/28, so please give him a call and let him know if that is all right. Because he does not have VM please leave an answer here as well if you do not speak to him so we can pass the answer along to him if he calls back. Thank you.

## 2013-12-10 NOTE — Telephone Encounter (Signed)
RN called the [facility] at the number given by patient.   RN left message that it will be fine to come by clinic tomorrow and pick up dressing supplies.    His next scheduled appointment with Dr Griffin BasilBrage is 12/24/13      Please call if additional questions.

## 2013-12-10 NOTE — Telephone Encounter (Signed)
Patient calling to follow up on this. He will be out of bandages tomorrow. Please follow up with patient. Thank you!

## 2013-12-10 NOTE — Telephone Encounter (Signed)
Relayed message below by Leona(RN). Verified appointment is 12/28/2013 not 12/24/2013

## 2013-12-27 ENCOUNTER — Other Ambulatory Visit (HOSPITAL_BASED_OUTPATIENT_CLINIC_OR_DEPARTMENT_OTHER): Payer: Self-pay | Admitting: Orthopedic Surgery

## 2013-12-27 DIAGNOSIS — M79671 Pain in right foot: Secondary | ICD-10-CM

## 2013-12-28 ENCOUNTER — Ambulatory Visit (HOSPITAL_BASED_OUTPATIENT_CLINIC_OR_DEPARTMENT_OTHER): Payer: 59 | Attending: Adult Health | Admitting: Orthopedic Surgery

## 2013-12-28 ENCOUNTER — Encounter (HOSPITAL_BASED_OUTPATIENT_CLINIC_OR_DEPARTMENT_OTHER): Payer: Self-pay | Admitting: Orthopedic Surgery

## 2013-12-28 VITALS — BP 131/85 | HR 95 | Resp 16 | Ht 68.0 in | Wt 215.0 lb

## 2013-12-28 DIAGNOSIS — Z981 Arthrodesis status: Secondary | ICD-10-CM

## 2013-12-28 DIAGNOSIS — S92901D Unspecified fracture of right foot, subsequent encounter for fracture with routine healing: Secondary | ICD-10-CM

## 2013-12-28 DIAGNOSIS — S8290XD Unspecified fracture of unspecified lower leg, subsequent encounter for closed fracture with routine healing: Secondary | ICD-10-CM | POA: Insufficient documentation

## 2013-12-28 DIAGNOSIS — M79609 Pain in unspecified limb: Secondary | ICD-10-CM | POA: Insufficient documentation

## 2013-12-28 DIAGNOSIS — M79671 Pain in right foot: Secondary | ICD-10-CM

## 2013-12-28 NOTE — Patient Instructions (Signed)
Partial progressive weightbearing  -In CAM boot started 25% weightbearing and advance 25% per week pain-free    Ok to CAM boot for ankle range of motion exercises

## 2013-12-28 NOTE — Progress Notes (Signed)
CHIEF COMPLAINT:   Postop follow-up status post midfoot fracture dislocation and primary arthrodesis of the talonavicular and calcaneocuboid with gastroc recession    HISTORY OF PRESENT ILLNESS:   Mr. Cristian Baker is a 53 year old male now 8 weeks status post above.  He's been doing well.  He's been in a cam boot.  He's been compliant with his nonweightbearing precautions for one fall in the shower.  He states he landed on the foot but it does not cause any new or severe pain.  Has otherwise endorses generalized numbness in the foot.  He has not had any wound drainage and redness, swelling, fevers, chills, sweats.    REVIEW OF SYSTEMS:  Pertinent positives listed in the HPI.     PHYSICAL EXAM:   Filed Vitals:    12/28/13 1345   BP: 131/85   Pulse: 95   Resp: 16   Height: 5\' 8"  (1.727 m)   Weight: 215 lb (97.523 kg)     General: No apparent distress  Skin: Well-healed surgical incisions medially and laterally. minimal eschar and scab which was debrided. no redness swelling or fluctuance  Neurologic: Intact but diffusely decreased sensation in the dorsal plantar aspect of the foot and toes  Musculoskeletal: Demonstrates EHL, FHL, tib ant, and gastrocsoleus  Vascular: Warm and well-perfused. palpable DP pulse    IMAGING:   X-rays of the foot were obtained today and reviewed.  Stable overall alignment from intraoperative films.  The hardware is in place without evidence of loosening or failure.  Good overall foot alignment.    ASSESSMENT:   8 weeks status post right  talonavicular and calcaneocuboid arthrodesis for a midfoot fracture dislocation.  Doing well no evidence of early complications.    PLAN:  1.  We will start progressive weightbearing.  This should be done under physical therapy guidance.  We will start with 25% weightbearing and advance at 25% per week.  This should be done in a cam boot.  2.  Okay to come on a cam boot for frequent ankle range of motion exercises  3.  Follow up with us in 6 weeks with repeat 3  views of the foot  4.  Call or return to clinic for any signs or symptoms of infection or new or increasing pain    -------------------------------------------  Attending: Burna SisMichael E Brage, MD  I saw and evaluated the patient and agree with Dr. Ethelene HalSchlepp's note.   Additional diagnoses: none    Date: 12/29/2013    Time: 5:21 AM    ----------------------------------------

## 2014-01-02 ENCOUNTER — Telehealth (HOSPITAL_BASED_OUTPATIENT_CLINIC_OR_DEPARTMENT_OTHER): Payer: Self-pay | Admitting: Orthopedic Surgery

## 2014-01-02 NOTE — Telephone Encounter (Signed)
CONFIRMED PHONE NUMBER: [phone]  CALLERS FIRST AND LAST NAME: Guss BundeMichael Eugene Leclaire  FACILITY NAME: na TITLE: na  CALLERS RELATIONSHIP:Self  RETURN CALL: Detailed message on voicemail only     SUBJECT: General Message   REASON FOR REQUEST: Patient was told by Dr. Griffin BasilBrage on 5/15 not to start his physical therapy for 90 days because he is not ready for weightbearing. Patient is currently scheduled for physical therapy on 7/1. Patient was contacted by the physical therapy clinic to reschedule because his appointment is too far out and his referral says that he is ready for weightbearing. Patient wants to check in with Dr. Griffin BasilBrage before rescheduling. Please assist, thank you!

## 2014-01-02 NOTE — Telephone Encounter (Signed)
RN called the pt at [facility] and left a message with Reuel BoomDaniel, RN advised that he can start working with PT and weight-bearing on his foot. RN provided the contact information for [facility].

## 2014-02-13 ENCOUNTER — Ambulatory Visit (HOSPITAL_BASED_OUTPATIENT_CLINIC_OR_DEPARTMENT_OTHER): Payer: 59 | Attending: Orthopedic Surgery | Admitting: Rehabilitative and Restorative Service Providers"

## 2014-02-13 DIAGNOSIS — M79609 Pain in unspecified limb: Secondary | ICD-10-CM | POA: Insufficient documentation

## 2014-02-13 DIAGNOSIS — IMO0001 Reserved for inherently not codable concepts without codable children: Secondary | ICD-10-CM | POA: Insufficient documentation

## 2014-02-13 DIAGNOSIS — M79673 Pain in unspecified foot: Secondary | ICD-10-CM | POA: Insufficient documentation

## 2014-02-13 DIAGNOSIS — M79671 Pain in right foot: Secondary | ICD-10-CM

## 2014-02-13 NOTE — Progress Notes (Signed)
PHYSICAL THERAPY INITIAL PLAN OF CARE            Certification      From:  02/13/2014           Through:   03/15/14    ONSET DATE:  05/02/2014  START OF CARE DATE: 02/13/2014    VISITS FROM SOC:   1      INTERPRETER  STATUS (Not needed, Telephonic, In Person):No   Referring Provider:  Bartholome Bill     Diagnosis:   Encounter Diagnoses   Name Primary?   . Other physical therapy Yes   . Right foot pain        PRECAUTIONS:  none    Mechanism of Injury: fell 10 feet into hole    Pertinent Medical/Surgical History:   Past Medical History   Diagnosis Date   . Disorder of muscle, ligament, and fascia      Past Surgical History   Procedure Laterality Date   . Unlisted procedure foot/toes       sp right ORIF for a open talotarsal dislocation, midtarsal arthrodesis and gastroc recession performed on 11/01/13   ____________________________________________________________________  Previous Therapy: successful for back and foot   Prior Level of Function:  Before onset of the current condition, the patient was able to walk with a walker and using a wheelchair    Home Environment: [place] with 3 flights     SUBJECTIVE:  [name] Statement:  I have been doing pretty well.  I slipped once in the shower, but they xrayed me and I'm okay.  I have some areas that still produce pus on my foot.      OBJECTIVE MEASURES / IMPAIRMENTS:  Edema: pitting and moderate--(pt. Has appropriate compression socks)  Sensation to light touch: intact  Circulation: intact  Scar mobility: lateral and medial scars are adhered and sensitive    Ankle/Foot ROM (Values are in degrees)   Right side   Ankle Dorsiflexion knee extended 6   Ankle Dorsiflexion knee flexed 15   Ankle Plantarflexion 18   Ankle Eversion 10   Ankle Inversion 15   Great Toe DF/PF 50/20       Ankle/Foot Strength  Tibialis Anterior: 5  Tibialis Posterior: 3  Fibularis Longus: 4  Extensor Hallucis Longus: 3  Flexor Hallucis Longus: 4  Extensor Digitorum Longus: 3  Flexor Digitorum Longus: 4  Gastroc  (nonweightbearing): 4    Ankle Mobility: TC joint stiff with ant glide>posterior glide      Gait  Ambulates with FWW and CAM boot WBAT now         TREATMENT & EDUCATION   [address] Physical therapy evaluation for 45 minutes (Untimed Code)             Primary Learner: pt. self  Topics Taught: ROM exercise, self stretching, scar release, skin care, edema management  Challenges Impacting this Teaching: None and Physical limitations  Desire and Motivation to Learn: Reports ready to learn/make changes  Post Education Response:  Demonstrates tasks with help and States understanding           Time in:   1305  Total Treatment Minutes:   55   Total Timed Code Minutes:  0      Home Exercise Program   Sitting: use hands to stretch and massage scars  --move foot/ankle to stretch all directions  --use belt/towel to stretch back of calf   Walking: wear boot at all times    a handout was  provided describing the exercises in written and picture format    ASSESSMENT:    This 53 y.o. Man presents to PT s/p R foot/ankle midtarsal arthrodesis and gastroc recession performed on 11/01/13.  He was referred to PT after his last follow up 12/28/2013, but he did not schedule until today.   His referral was for progressive wt. Bearing at that time, which now he is up to 100% on the R.  He still has a CAM boot and uses a FWW for all ambulation.  His skin is a factor with dry flaking resulting in small lesions.  He was instructed in appropriate skin care in addition to scar and edema management.  He lacks ROM in his ankle and foot complex as noted.  He will need a follow up with orthopedics to determine if he is ready to wean from his CAM boot. Until then he was instructed in appropriate stretching, ROM and gait activities.  We instructed him to follow up with orthopedics prior to seeing [place] next.          Outcomes Score: CareConnections: Functional Index Score 33   Primary Functional Assessment Tool for G Coding:   LE TAOS    FALL RISK:    high fall  risk based on screening       PLAN:   Therapy Treatment Plan: Patient will be seen for 3 visits per month   until plan of care reviewed.    Plan for next visit:  Per orthopedics, may work with wt. Bearing exercises and strengthening pending    To improve impairments and reach functional goals the following interventions will be performed: therapeutic exercise, therapeutic functional activity, neuromuscular re-education, gait training, manual therapy and patient education    DISCHARGE GOALS - Anticipated Discharge Date 06/15/2014  1. Pt. Will be able to walk over all terrains without use of assistive device  2. Pt. Will be able to walk for exercise as needed    Current Level of Function /  Progress Toward Goal NEW Short Term Goals / Plan of Care Review    Date Updated:  02/13/2014 Date to be Reviewed:  03/15/14 MET / Benard Halsted   Unable to walk without CAM boot   Pt. Will be able to walk inside home without CAM boot    R ankle PF is limited to 18 deg which causes difficulty with walking    Pt will increase R ankle PF to minimum of 25 deg as needed for improved walking out of boot inside of home          Rehabilitation potential is: Good    Potential Barriers to achieve rehab goals:  Resources, Chronicity or severity and Living environment    These goals were discussed and Patient agrees with plan.    Cultural Practices that Influence Care: none    JaneTalcott, PT, DPT  [facility]  [facility]  Phone: [phone]   FAX: [phone]

## 2014-03-07 ENCOUNTER — Ambulatory Visit (HOSPITAL_BASED_OUTPATIENT_CLINIC_OR_DEPARTMENT_OTHER): Payer: 59 | Attending: Foot and Ankle Surgery | Admitting: Orthopedic Surgery

## 2014-03-07 VITALS — BP 148/89 | HR 72 | Ht 68.0 in | Wt 215.0 lb

## 2014-03-07 DIAGNOSIS — S92251S Displaced fracture of navicular [scaphoid] of right foot, sequela: Secondary | ICD-10-CM

## 2014-03-07 DIAGNOSIS — Z4789 Encounter for other orthopedic aftercare: Secondary | ICD-10-CM

## 2014-03-07 DIAGNOSIS — S8290XS Unspecified fracture of unspecified lower leg, sequela: Secondary | ICD-10-CM | POA: Insufficient documentation

## 2014-03-07 NOTE — Progress Notes (Signed)
CHIEF COMPLAINT:   Postop follow-up status post midfoot fracture dislocation and primary arthrodesis of the talonavicular and calcaneocuboid with gastroc recession    HISTORY OF PRESENT ILLNESS:   Mr. Cristian Baker is a 53 year old male now 4 mo status post above.  He's been doing well.  He's been in a cam boot.  He continues to use a walker for support. He has not progressed his weight bearing much since we last saw him 2 mo ago because he has been unable to see a PT. He continues to have intermittent swelling and persistent numbness over his medial dorsal and plantar foot into his great toe. He otherwise has no pain and is doing well. He is pleased with his progress.    REVIEW OF SYSTEMS:  Pertinent positives listed in the HPI.     PHYSICAL EXAM:   Filed Vitals:    03/07/14 1351   BP: 148/89   Pulse: 72   Height: 5\' 8"  (1.727 m)   Weight: 215 lb (97.523 kg)     General: No apparent distress  Skin: Well-healed surgical incisions medially and laterally. minimal eschar and scab which was debrided. no redness swelling or fluctuance. Mild swelling appreciated medially over his mid foot  Neurologic: Intact sensation to light touch but diffusely decreased sensation in the dorsal plantar aspect of the foot and toes  Musculoskeletal: Demonstrates EHL, FHL, tib ant, and gastrocsoleus  Vascular: Warm and well-perfused. palpable DP pulse    IMAGING:   X-rays of the foot were obtained today and reviewed.  Stable overall alignment from intraoperative films.  The hardware is in place without evidence of loosening or failure. Maintained foot alignment.    ASSESSMENT:   4 mo status post right  talonavicular and calcaneocuboid arthrodesis for a midfoot fracture dislocation.  Doing well no evidence of early complications.    PLAN:  1.  continue progressive weightbearing.  WBAT in CAM boot, and wean walker and boot as tolerated.  2.  Okay to come on a cam boot for frequent ankle range of motion exercises  3.  Follow up with us PRN with  repeat 3 views of the foot  4.  Call or return to clinic for any signs or symptoms of infection or new or increasing pain

## 2014-03-07 NOTE — Patient Instructions (Signed)
ASSESSMENT:   4 mo status post right  talonavicular and calcaneocuboid arthrodesis for a midfoot fracture dislocation.  Doing well no evidence of early complications.    PLAN:  1.  continue progressive weightbearing.  WBAT in CAM boot, and wean walker and boot as tolerated.  2.  Okay to come on a cam boot for frequent ankle range of motion exercises  3.  Follow up with us PRN with repeat 3 views of the foot  4.  Call or return to clinic for any signs or symptoms of infection or new or increasing pain

## 2014-03-14 ENCOUNTER — Ambulatory Visit (HOSPITAL_BASED_OUTPATIENT_CLINIC_OR_DEPARTMENT_OTHER): Payer: 59 | Admitting: Rehabilitative and Restorative Service Providers"

## 2014-03-14 DIAGNOSIS — M79671 Pain in right foot: Secondary | ICD-10-CM

## 2014-03-14 DIAGNOSIS — IMO0001 Reserved for inherently not codable concepts without codable children: Secondary | ICD-10-CM

## 2014-03-14 NOTE — Progress Notes (Signed)
PHYSICAL THERAPY PLAN OF CARE REVIEW         Certification      From:  03/14/2014           Through:   03/13/14    ONSET DATE:  05/02/2014  START OF CARE DATE: 02/13/2014    VISITS FROM SOC:   2      INTERPRETER  STATUS (Not needed, Telephonic, In Person):No   Referring Provider:  Bartholome Bill     Diagnosis:   Encounter Diagnoses   Name Primary?   . Other physical therapy Yes   . Right foot pain        PRECAUTIONS: continue progressive weightbearing. WBAT in CAM boot, and wean walker and boot as tolerated. Per orthopedics 03/07/2014     Mechanism of Injury: fell 10 feet into hole    Pertinent Medical/Surgical History:   Past Medical History   Diagnosis Date   . Disorder of muscle, ligament, and fascia      Past Surgical History   Procedure Laterality Date   . Unlisted procedure foot/toes       sp right ORIF for a open talotarsal dislocation, midtarsal arthrodesis and gastroc recession performed on 11/01/13   ____________________________________________________________________    SUBJECTIVE:  [name] Statement:  I'm taking everything one day at a time.  I still use the walker and boot--sometimes I go without it, but I still get some pain now and then.  I do the exercises that you taught me.      OBJECTIVE MEASURES / IMPAIRMENTS:  Edema: minimal now--  Sensation to light touch: intact  Circulation: intact  Scar mobility: lateral adhered still    Ankle/Foot ROM (Values are in degrees)   Right side   Ankle Dorsiflexion knee extended 10   Ankle Dorsiflexion knee flexed 15   Ankle Plantarflexion 35   Ankle Eversion 15   Ankle Inversion 17   Great Toe DF/PF 50/20       Ankle Mobility: TC joint stiff with ant glide>posterior glide  Sub talar stiff with med/lat glides      Gait  Ambulates with FWW and CAM boot WBAT now         TREATMENT & EDUCATION   97110 Therapeutic Exercise for 35 minutes  Stretching gastroc sitting  AROM R ankle isolating ankle versus whole leg motions  PROM R ankle sitting--pt. Stretching self,  instruction in seated PF stretch dragging front of foot      97116 Gait Training for 10 minutes  Pt. Issued SPC and instructed in use in L hand  Cues required for equal stride length and use of cane properly  Instruction in starting to use regular shoe inside  Home to tolerance now           Primary Learner: pt. self  Topics Taught: ROM exercise, self stretching, scar release, gait with cane  Challenges Impacting this Teaching: None and Physical limitations  Desire and Motivation to Learn: Reports ready to learn/make changes  Post Education Response:  Demonstrates tasks with help and States understanding           Time in:  0830  Total Treatment Minutes:   45   Total Timed Code Minutes:  45      Home Exercise Program   Sitting: use hands to stretch and massage scars  --move foot/ankle to stretch all directions  --use belt/towel to stretch back of calf   Walking: wear regular shoe to tolerance    a  handout was provided describing the exercises in written and picture format    ASSESSMENT:    Good progress with range of motion exercise, but weakness and use of CAM boot continues to hinder other strength and function.  Cristian Baker was instructed in use of cane and to start wearing his regular shoe.  PT will progress per his tolerance as able now.        PLAN:     Plan for next visit:  Progress strengthening, balance/proprioception and gait (regular shoe)    DISCHARGE GOALS - Anticipated Discharge Date 06/15/2014  1. Pt. Will be able to walk over all terrains without use of assistive device  2. Pt. Will be able to walk for exercise as needed    Current Level of Function /  Progress Toward Goal NEW Short Term Goals / Plan of Care Review    Date Updated:  02/13/2014 Date to be Reviewed:  03/15/14 MET / Benard Halsted   Unable to walk without CAM boot   Pt. Will be able to walk inside home without CAM boot GOAL NOT MET   R ankle PF is limited to 18 deg which causes difficulty with walking    Pt will increase R ankle PF to minimum of 25 deg  as needed for improved walking out of boot inside of home GOAL MET     Current Level of Function /  Progress Toward Goal NEW Short Term Goals / Plan of Care Review Status MET/UNMET   Date Updated:  03/14/2014 Date to be Reviewed:  04/13/14 04/13/14   Unable to walk in regular shoe   Pt. Will be able to walk with regular shoe for all needs    Unable to balance on R foot standing which limits ability to walk without use of assistive device   Pt. Will demonstrate ability to stand on R LE only x 30 sec in regular shoe as needed for progression of gait without need for assistive device          JaneTalcott, PT, DPT  [facility]  Phone: [phone]   FAX: [phone]

## 2014-03-18 ENCOUNTER — Ambulatory Visit (HOSPITAL_BASED_OUTPATIENT_CLINIC_OR_DEPARTMENT_OTHER): Payer: 59 | Attending: Orthopedic Surgery | Admitting: Rehabilitative and Restorative Service Providers"

## 2014-03-18 DIAGNOSIS — IMO0001 Reserved for inherently not codable concepts without codable children: Secondary | ICD-10-CM | POA: Insufficient documentation

## 2014-03-18 DIAGNOSIS — M79609 Pain in unspecified limb: Secondary | ICD-10-CM | POA: Insufficient documentation

## 2014-03-18 NOTE — Progress Notes (Addendum)
PHYSICAL THERAPY TREATMENT NOTE  __________________________________________________________________  Certification      From:  Certification      From 03/14/2014 Through: 04/13/14    ONSET DATE:  05/02/2014  START OF CARE DATE: 02/13/2014    VISITS FROM SOC:   3      INTERPRETER  STATUS (Not needed, Telephonic, In Person):No   Referring Provider:  Burna SisMichael E Baker     Diagnosis:   No diagnosis found.    PRECAUTIONS: continue progressive weightbearing. WBAT in CAM boot, and wean walker and boot as tolerated. Per orthopedics 03/07/2014     Mechanism of Injury: fell 10 feet into hole    Pertinent Medical/Surgical History:   Past Medical History   Diagnosis Date   . Disorder of muscle, ligament, and fascia      Past Surgical History   Procedure Laterality Date   . Unlisted procedure foot/toes       sp right ORIF for a open talotarsal dislocation, midtarsal arthrodesis and gastroc recession performed on 11/01/13        SUBJECTIVE:  [name] Statement: He reports he is doing all his exercise daily as instructed.  He couldn't find the shoes to fit his R foot since it is still swollen quite a bit.  He is practicing walking without using a cane at home with boot on.     OBJECTIVE MEASURES/IMPAIRMENTS:  AROM: R ankle DF knee straight to neutral  AROM: R ankle DF knee bent +3  AROM: R ankle PF 15  AAROM: R inv 5, Ever 10    TREATMENT & EDUCATION:  Intervention 1:   97110 Therapeutic exercise for 30 minutes                    Ankle and forefoot AROM all direction  Given manual assist to stretch  Seated R foot on large balance board DF<->PF, Inv<->ever, CW and CCW  R foot Inv and Ever isometrics 10 sec hold x 10 reps each  Standing on R L hip abd, ext and flex x 10  B heel raises  Standing R 1st MT joint stretch  Standing calf stretch  R SLS up as tolerate with UE support  Cool down recombent bike--ankle ROM, x 5 min/fat burn    Intervention 2:  97116 Gait training for 15 minutes                           // bars: gait with no camboot  on, progress to one hand hold on // bar, worked on equal weight distribution, stance time and stride length  // bars: practiced weight shift heel to toe and lateral weight shift      [name] learning needs, abilities, preferences and readiness per Initial POC were considered in this session.  Learning verified by teach back.    Time in: 0910  Total Treatment Minutes:   45  Total Timed Code Minutes:  45      HOME EXERCISE PROGRAM (HEP):   Sitting: use hands to stretch and massage scars  --move foot/ankle to stretch all directions  --use belt/towel to stretch back of calf   Walking: wear regular shoe to tolerance    8/3:  R foot Inv and Ever isometrics 10 sec hold x 10 reps each  Standing on R L hip abd, ext and flex   B heel raises  Standing R 1st MT joint stretch  R SLS progress as able  a handout was provided describing the exercises in written and picture format     Assessment / Patient Response to Therapy:   Good effort performed with all above exercise.   Occasional report of gastroc muscle tightness with weight bearing,  however, working progress using ankle and forefoot ROM in functional way.  Will continue to assist him working toward his goals.      PLAN:  Continue per treatment plan of care    Plan for next visit: cont to advance gait, check gait quality, proprioception training.     Cristian Baker, PTA  [facility]  [facility]  Phone: [phone]   FAX: [phone]      I agree with the above treatment, assessment and plan as written.    Cristian Baker, PT, DPT

## 2014-03-26 ENCOUNTER — Ambulatory Visit (HOSPITAL_BASED_OUTPATIENT_CLINIC_OR_DEPARTMENT_OTHER): Payer: 59 | Admitting: Rehabilitative and Restorative Service Providers"

## 2014-03-26 DIAGNOSIS — IMO0001 Reserved for inherently not codable concepts without codable children: Secondary | ICD-10-CM

## 2014-03-26 NOTE — Progress Notes (Addendum)
PHYSICAL THERAPY TREATMENT NOTE  __________________________________________________________________  Certification      From:  03/14/2014           Through:   04/13/14    ONSET DATE:  05/02/2014  START OF CARE DATE: 02/13/2014    VISITS FROM SOC:   4      INTERPRETER  STATUS (Not needed, Telephonic, In Person):No   Referring Provider:  Burna Sis     Diagnosis:   No diagnosis found.    PRECAUTIONS: continue progressive weightbearing. WBAT in CAM boot, and wean walker and boot as tolerated. Per orthopedics 03/07/2014     Mechanism of Injury: fell 10 feet into hole    Pertinent Medical/Surgical History:   Past Medical History   Diagnosis Date   . Disorder of muscle, ligament, and fascia      Past Surgical History   Procedure Laterality Date   . Unlisted procedure foot/toes       sp right ORIF for a open talotarsal dislocation, midtarsal arthrodesis and gastroc recession performed on 11/01/13        SUBJECTIVE:  [name] Statement: He reports has been doing weight shift exercise on bare foot at home but his home are not carpeted but tile his heel get sore from standing.  He continues working on getting new shoes to fit.  His R toe nail has started to dig/push 2nd toe and it is becoming uncomfortable.     OBJECTIVE MEASURES/IMPAIRMENTS:  ROM unchanged from the last visit  AROM: R ankle DF knee straight to neutral  AROM: R ankle DF knee bent +3  AROM: R ankle PF 15    TREATMENT & EDUCATION:  Intervention 1:   97110 Therapeutic exercise for 40 minutes                    Ankle and forefoot AROM all direction  Given manual assist to stretch  R foot Inv and Ever isometrics 10 sec hold x 10 reps each  PF resisted with black T band in seated    Discussed with PT foot specialist about his issue  Foot specialist done quick assess, placed spacer between 1st and 2nd toe and taped COBAN to correct hallux valgus  Seated peroneal longus exercise/button push    Standing on R L hip abd, ext and flex x 10  SLS on R L hip abd, ext and  flex resisted with yellow T band x 10 each  B heel raises  Standing calf stretch  R SLS up as tolerate with UE support  Tilt board sagital plane worked on ankle AROM with min UE support and balance    Intervention 2:  97116 Gait training for 5 minutes                           // bars: gait with no camboot on, worked on min UE support, heel to toe weight shift with gait,  worked on equal weight distribution, stance time and stride length    [name] learning needs, abilities, preferences and readiness per Initial POC were considered in this session.  Learning verified by teach back.    Time in: 0910  Total Treatment Minutes:   45  Total Timed Code Minutes:  45      HOME EXERCISE PROGRAM (HEP):   Sitting: use hands to stretch and massage scars  --move foot/ankle to stretch all directions  --use belt/towel to stretch back of calf  Walking: wear regular shoe to tolerance    8/3:  R foot Inv and Ever isometrics 10 sec hold x 10 reps each  Standing on R L hip abd, ext and flex   B heel raises  Standing R 1st MT joint stretch  R SLS progress as able    8/11:  SLS on R L hip abd, ext and flex resisted with yellow T band x 10 each    a handout was provided describing the exercises in written and picture format     Assessment / Patient Response to Therapy:   Continue to demonstrate good effort and compliance with his home exercise program.  Continues to work on advancing weight bearing activities, and partially, limited from his R heel discomfort.  Strongly encouraged him to find the right shoe ware in order to advance further functional strength and proprioception training.  Will continue to assist him working toward his goals.      PLAN:  Continue per treatment plan of care    Plan for next visit: cont to advance gait, check gait quality, proprioception training.     Glennie IsleAyumi  Nakano, PTA  [facility]  [facility]  Phone: [phone]   FAX: [phone]      I agree  with the above treatment, assessment and plan as written.    JaneTalcott, PT, DPT

## 2014-03-29 ENCOUNTER — Encounter (HOSPITAL_BASED_OUTPATIENT_CLINIC_OR_DEPARTMENT_OTHER): Payer: Self-pay | Admitting: Orthopedic Surgery

## 2014-04-01 ENCOUNTER — Encounter (HOSPITAL_BASED_OUTPATIENT_CLINIC_OR_DEPARTMENT_OTHER): Payer: 59 | Admitting: Rehabilitative and Restorative Service Providers"

## 2014-04-04 ENCOUNTER — Ambulatory Visit (HOSPITAL_BASED_OUTPATIENT_CLINIC_OR_DEPARTMENT_OTHER): Payer: 59 | Admitting: Rehabilitative and Restorative Service Providers"

## 2014-04-04 DIAGNOSIS — M79671 Pain in right foot: Secondary | ICD-10-CM

## 2014-04-04 DIAGNOSIS — IMO0001 Reserved for inherently not codable concepts without codable children: Secondary | ICD-10-CM

## 2014-04-04 NOTE — Progress Notes (Signed)
PHYSICAL THERAPY TREATMENT NOTE  __________________________________________________________________  Certification      From:  03/14/2014           Through:   04/13/14    ONSET DATE:  05/02/2014  START OF CARE DATE: 02/13/2014    VISITS FROM SOC:   5    INTERPRETER  STATUS (Not needed, Telephonic, In Person):No   Referring Provider:  Burna SisMichael E Brage     Diagnosis:   No diagnosis found.    PRECAUTIONS: continue progressive weightbearing. WBAT in CAM boot, and wean walker and boot as tolerated. Per orthopedics 03/07/2014     Mechanism of Injury: fell 10 feet into hole    Pertinent Medical/Surgical History:   Past Medical History   Diagnosis Date   . Disorder of muscle, ligament, and fascia      Past Surgical History   Procedure Laterality Date   . Unlisted procedure foot/toes       sp right ORIF for a open talotarsal dislocation, midtarsal arthrodesis and gastroc recession performed on 11/01/13        SUBJECTIVE:  [name] Statement: He lives in shelter and have no income.  So he is having hard time getting new shoes to fit his swallen R foot.  More so lately his R heel bothering him with any weight bearing.  He still does all exercise he was told to do daily.  He needed to do lots of walking in last 2 days his foot is sore.  Toe spacer he is using it and it is not hurting anymore like used to.         TREATMENT & EDUCATION:  Intervention 1:   97110 Therapeutic exercise for 30 minutes                    Quick check on his R heel, dry skin but no discoloration, nor skin damage, light pressure with pin point no increased pain nor discomfort     Ankle and forefoot AROM all direction  Given manual assist to stretch  R foot Inv and Ever isometrics 10 sec hold x 10 reps each  Seated peroneal longus exercise/button push  Tilt board sagital and frontal plane rocking, static standing balance   Standing on R L hip abd, ext and flex x 10--needing UE support due to R heel pain with weight bearing(placed towel under heel but very  minimal change)  SLS on R L hip abd, ext and flex resisted with yellow T band--limited performance due to R heel pain, needing UE support heavily  B heel raises with UE support  Standing calf stretch  R SLS up as tolerate with UE support, 10 sec(increased R heel pain)  tendom standing weight shift forward x 10  Placed heel gel pad to ease his symptoms, will keep monitor through next visit.       Intervention 2:  97140 Manual Therapy for 10 minutes                           Given great toe distraction and mobilization grade III-IV   Given great toe flex and ext--very sensitive in both direction  Great toe abduction isometrics and facilitation  2-5th toe ext and flex as tolerated--very sensitive  Advised to trim his toe nails    [name] learning needs, abilities, preferences and readiness per Initial POC were considered in this session.  Learning verified by teach back.    Time in:  0945  Total Treatment Minutes:   45  Total Timed Code Minutes:  45      HOME EXERCISE PROGRAM (HEP):   Sitting: use hands to stretch and massage scars  --move foot/ankle to stretch all directions  --use belt/towel to stretch back of calf   Walking: wear regular shoe to tolerance    8/3:  R foot Inv and Ever isometrics 10 sec hold x 10 reps each  Standing on R L hip abd, ext and flex   B heel raises  Standing R 1st MT joint stretch  R SLS progress as able    8/11:  SLS on R L hip abd, ext and flex resisted with yellow T band x 10 each    a handout was provided describing the exercises in written and picture format     Assessment / Patient Response to Therapy:   Continue to show good effort being compliant with his exercise, however, today's performance greatly limited with increased R heel pain with increased weight bearing.  Given heel gel pad to ease some discomfort, however, heavily needing UE support maintaining normal gait pattern.  Will continue to closely monitor his activity tolerance and progress working toward improving right  lower extremity functional strength.      PLAN:  Continue per treatment plan of care    Plan for next visit: check his R heel pain, cont to advance gait, check gait quality, proprioception training.     Glennie Isle, PTA  [facility]  [facility]  Phone: [phone]   FAX: [phone]      I agree with the above treatment, assessment and plan as written.    Dayna Ramus, PT, DPT

## 2014-04-09 ENCOUNTER — Ambulatory Visit (HOSPITAL_BASED_OUTPATIENT_CLINIC_OR_DEPARTMENT_OTHER): Payer: 59 | Admitting: Rehabilitative and Restorative Service Providers"

## 2014-04-09 DIAGNOSIS — IMO0001 Reserved for inherently not codable concepts without codable children: Secondary | ICD-10-CM

## 2014-04-09 DIAGNOSIS — M79671 Pain in right foot: Secondary | ICD-10-CM

## 2014-04-09 NOTE — Progress Notes (Signed)
PHYSICAL THERAPY TREATMENT NOTE  __________________________________________________________________  Certification      From:  03/14/2014           Through:   04/13/14    ONSET DATE:  05/02/2014  START OF CARE DATE: 02/13/2014    VISITS FROM SOC:   6    INTERPRETER  STATUS (Not needed, Telephonic, In Person):No   Referring Provider:  Burna Sis     Diagnosis:   No diagnosis found.    PRECAUTIONS: continue progressive weightbearing. WBAT in CAM boot, and wean walker and boot as tolerated. Per orthopedics 03/07/2014     Mechanism of Injury: fell 10 feet into hole    Pertinent Medical/Surgical History:   Past Medical History   Diagnosis Date   . Disorder of muscle, ligament, and fascia      Past Surgical History   Procedure Laterality Date   . Unlisted procedure foot/toes       sp right ORIF for a open talotarsal dislocation, midtarsal arthrodesis and gastroc recession performed on 11/01/13        SUBJECTIVE:  [name] Statement: I have been trying to do everything at the shelter but it is hard.  I walk some, but my heel hurts still.         OBJECTIVE:  Observation: pt. Presents with CAM boot R and SPC L  ROM: R ankle DF with knee extended=+8  PF=15  STRENGTH: R gastroc=3(-)/5  BALANCE: unable to SL balance R  GIRTH: 7 cm distal to tibial tubercle R=38.8 cm;  L=42.2 cm      TREATMENT & EDUCATION:    Intervention 1:   97110 Therapeutic exercise for 25 minutes                    Seated stretch for R ankle PF   Standing gastroc strengthening with DL heel raises progressing wt. Onto R  SL balance R--instructed in use of intrinsics for stabilization  Seated resisted gastroc strengthening with green theraband    97116 Gait Training for 10 minutes  Instruction in heel toe components of gait  Emphasis on toe off with use of calf muscles as tolerated    [name] learning needs, abilities, preferences and readiness per Initial POC were considered in this session.  Learning verified by teach back.    Time in: 0947  Total  Treatment Minutes:   35  Total Timed Code Minutes:  35      HOME EXERCISE PROGRAM (HEP):   Sitting: use hands to stretch and massage scars  --move foot/ankle to stretch all directions  --use belt/towel to stretch back of calf   Walking: wear regular shoe to tolerance--practice equal sized steps and using heel and toes    8/3:  R foot Inv and Ever isometrics 10 sec hold x 10 reps each  Standing on R L hip abd, ext and flex   B heel raises  Standing R 1st MT joint stretch  R SLS progress as able    8/11:  SLS on R L hip abd, ext and flex resisted with yellow T band x 10 each    a handout was provided describing the exercises in written and picture format     Assessment / Patient Response to Therapy:   Cristian Baker continues to present with his CAM boot and not using a regular shoe.  PT explained the need for the shoe now to assist with ankle motion, muscle strengthening and balance.  He verbalized better understanding  and also understands his priority for ankle motion needed first to then work with muscle strengthening and balance.  Follow up in 2 weeks for plan of care review.  Pt. Agreed he would come with a regular shoe for the R foot next time.    PLAN:  Continue per treatment plan of care    Plan for next visit: progress strengthening and gait training, plan of care review    Dayna Ramus PT, DPT  [facility]  [facility]  Phone: [phone]   FAX: [phone]

## 2014-04-23 ENCOUNTER — Ambulatory Visit (HOSPITAL_BASED_OUTPATIENT_CLINIC_OR_DEPARTMENT_OTHER): Payer: 59 | Attending: Orthopedic Surgery | Admitting: Rehabilitative and Restorative Service Providers"

## 2014-04-23 DIAGNOSIS — M79671 Pain in right foot: Secondary | ICD-10-CM

## 2014-04-23 DIAGNOSIS — M79609 Pain in unspecified limb: Secondary | ICD-10-CM | POA: Insufficient documentation

## 2014-04-23 DIAGNOSIS — IMO0001 Reserved for inherently not codable concepts without codable children: Secondary | ICD-10-CM | POA: Insufficient documentation

## 2014-04-23 NOTE — Progress Notes (Signed)
PHYSICAL THERAPY PLAN OF CARE REVIEW          Certification      From:  04/23/2014            Through:   05/23/14  ONSET DATE: 05/02/2014  START OF CARE DATE: 02/13/2014    VISITS FROM SOC: 7      INTERPRETER STATUS (Not needed, Telephonic, In Person):No   Referring Provider: Kashmir E Brage     Diagnosis:   Encounter Diagnoses    Name  Primary?    .  Other physical therapy  Yes    .  Right foot pain         PRECAUTIONS: continue progressive weightbearing. WBAT in CAM boot, and wean walker and boot as tolerated. Per orthopedics 03/07/2014     Mechanism of Injury: fell 10 feet into hole    Pertinent Medical/Surgical History:    Past Medical History    Past Medical History    Diagnosis  Date    .  Disorder of muscle, ligament, and fascia           Past Surgical History    Past Surgical History    Procedure  Laterality  Date    .  Unlisted procedure foot/toes           sp right ORIF for a open talotarsal dislocation, midtarsal arthrodesis and gastroc recession performed on 11/01/13       SUBJECTIVE:  [name] Statement: I finally got some new shoes, but I forgot them today.  Pt. Reports he has been trying to walk more on his own with using no shoes or the new ones.    OBJECTIVE MEASURES / IMPAIRMENTS:  R ankle plantarflexion=30 deg with stiffness  JOINT MOBILITY: limited with anterior glide of TC joint  STRENGTH: R gastroc=3/5      TREATMENT & EDUCATION:    97110 Therapeutic exercise for 45 minutes                    Seated stretch for R ankle PF --reinstruction required  Standing gastroc strengthening with DL heel raises progressing wt. Onto R (pt. Estimates 35% wt. On R)  SL balance R--instructed in use of intrinsics for stabilization  Seated resisted gastroc strengthening with black theraband  Instruction in standing posture with equal weight bearing and arch protection    [name] learning needs, abilities, preferences and readiness per Initial POC were considered in this session.  Learning verified by teach  back.      Time in:   1032  Total Treatment Minutes:   45   Total Timed Code Minutes:  45      Home Exercise Program   Sitting: use hands to stretch and massage scars  --move foot/ankle to stretch all directions  --use belt/towel to stretch back of calf   Walking: wear regular shoe to tolerance--practice equal sized steps and using heel and toes  R foot Inv and Ever isometrics 10 sec hold x 10 reps each  Standing on R L hip abd, ext and flex   B heel raises--put more weight onto R as able to tolerate  Standing R 1st MT joint stretch  R SLS progress as able  SLS on R L hip abd, ext and flex resisted with yellow T band x 10 each    a handout was provided describing the exercises in written and picture format     ASSESSMENT  :    Cristian Baker continues to present   with his CAM boot in place and just acquired a pair of shoes he can wear.  His lack of resources and homelessness are barriers to rate of progression.  He remains motivated and is performing his home exercise as best he can.  We continue to provide instruction and recommend further PT to progress him as able.        Outcomes Score: CareConnections: Functional Index Score  21   Primary Functional Assessment Tool for G Coding:   LOWER EXTREMITY TAOS (incomplete)       PLAN:   Treatment Plan : Patient will be seen for 2 visits per month   until plan of care reviewed.    Plan for next visit:  Work with functional gait and strengthening in regular shoes    Therapy will include therapeutic exercise, therapeutic activities, neuromuscular reeducation and gait training         CURRENT LEVEL OF FUNCTION:  1. Just got a pair of shoes and is starting to walk inside on level surfaces   2. Walking inside with slippers he just got  3. Balance on the right leg remains impaired able x 10 sec    Current Level of Function /  Progress Toward Goal  NEW Short Term Goals / Plan of Care Review  Status MET/UNMET    Date Updated:  03/14/2014  Date to be Reviewed:  04/13/14  04/13/14    Unable  to walk in regular shoe Pt. Will be able to walk with regular shoe for all needs  GOAL NOT MET   Unable to balance on R foot standing which limits ability to walk without use of assistive device Pt. Will demonstrate ability to stand on R LE only x 30 sec in regular shoe as needed for progression of gait without need for assistive device  GOAL NOT MET       Prior Level of Function   Short Term Goals for Current Certification Period Status MET/UNMET   Date Updated:  04/23/2014 Date to be Reviewed:  05/23/14 05/23/14   Unable to walk with regular shoe   Pt. Will wear regular shoe for all needs walking    Limited R ankle plantarflexion needed for normalizing gait   Pt. Will improve R ankle plantarflexion > 40 deg as needed for more normal gait pattern        DISCHARGE GOALS - Anticipated Discharge Date 07/15/2014 (extended)  1. Pt. Will be able to walk over all terrains without use of assistive device  2. Pt. Will be able to walk for exercise as needed      Cristian Baker, PT, DPT  [facility]  [facility]  Phone: [phone]   FAX: [phone]

## 2014-05-01 ENCOUNTER — Ambulatory Visit (HOSPITAL_BASED_OUTPATIENT_CLINIC_OR_DEPARTMENT_OTHER): Payer: 59 | Admitting: Rehabilitative and Restorative Service Providers"

## 2014-05-01 DIAGNOSIS — IMO0001 Reserved for inherently not codable concepts without codable children: Secondary | ICD-10-CM

## 2014-05-01 DIAGNOSIS — M79671 Pain in right foot: Secondary | ICD-10-CM

## 2014-05-01 NOTE — Progress Notes (Signed)
PHYSICAL THERAPY TREATMENT NOTE         Certification      From:  04/23/2014            Through:   05/23/14  ONSET DATE: 05/02/2014  START OF CARE DATE: 02/13/2014    VISITS FROM SOC: 8      INTERPRETER STATUS (Not needed, Telephonic, In Person):No   Referring Provider: Burna Sis     Diagnosis:   Encounter Diagnoses    Name  Primary?    .  Other physical therapy  Yes    .  Right foot pain         PRECAUTIONS: continue progressive weightbearing. WBAT in CAM boot, and wean walker and boot as tolerated. Per orthopedics 03/07/2014     Mechanism of Injury: fell 10 feet into hole      sp right ORIF for a open talotarsal dislocation, midtarsal arthrodesis and gastroc recession performed on 11/01/13       SUBJECTIVE:  [name] Statement: Pt. reports he has been wearing his regular shoes [facility] the shelter, but he forgot the shoes again today.   He estimates he is wearing regular shoes about 55% now inside.      OBJECTIVE MEASURES / IMPAIRMENTS:  SL stance R: 5 sec with increased wt. Bearing on heel versus forefoot      TREATMENT & EDUCATION:      97110 Therapeutic exercise for 45 minutes    Warm up with tilt board x 3 min sagittal plane  Gastroc stretch on 1/2 foam   Active SL heel raise R on 1/2 foam  PF strengthening on R with 1/2 foam  PF stretching in standing with foam pad cushion--pt. Pulling foot against it  Active step training in stride with cues for forefoot activation and toe off  Simulated boxing jab (familiar to pt.) in stride with R foot back   Backward walking  Gait: no shoes with cues for equal stride and to activate forefoot at terminal stance                        [name] learning needs, abilities, preferences and readiness per Initial POC were considered in this session.  Learning verified by teach back.      Time in:  1412  Total Treatment Minutes:   45   Total Timed Code Minutes:  45      Home Exercise Program   Sitting: use hands to stretch and massage scars  --move foot/ankle to  stretch all directions  --use belt/towel to stretch back of calf   Walking: wear regular shoe to tolerance--practice equal sized steps and using heel and toes  --walk backward  R foot Inv and Ever using green band  B heel raises--put more weight onto R as able to tolerate  Standing R 1st MT joint stretch  Practice pushing off of back foot in stance like you are punching a jab    a handout was provided describing the exercises in written and picture format     ASSESSMENT  :   No shoes again today.  Pt. Reports he forgot them again.  In the clinic he is able to demonstrate gait without shoes but has avoidance of forefoot activation at terminal stance.  He states he wants to wear his regular shoes and is working on it at the shelter.  PT emphasized his need to bring his shoes to work.  PLAN:   Plan for next visit:  Work with functional gait and strengthening in regular shoes        JaneTalcott, PT, DPT  [facility]  [facility]  Phone: [phone]   FAX: [phone]

## 2014-05-14 ENCOUNTER — Encounter (HOSPITAL_BASED_OUTPATIENT_CLINIC_OR_DEPARTMENT_OTHER): Payer: 59 | Admitting: Rehabilitative and Restorative Service Providers"

## 2014-05-20 ENCOUNTER — Ambulatory Visit (HOSPITAL_BASED_OUTPATIENT_CLINIC_OR_DEPARTMENT_OTHER): Payer: 59 | Attending: Orthopedic Surgery | Admitting: Rehabilitative and Restorative Service Providers"

## 2014-05-20 DIAGNOSIS — S92251S Displaced fracture of navicular [scaphoid] of right foot, sequela: Secondary | ICD-10-CM | POA: Insufficient documentation

## 2014-05-20 DIAGNOSIS — M79671 Pain in right foot: Secondary | ICD-10-CM | POA: Insufficient documentation

## 2014-05-20 NOTE — Progress Notes (Signed)
PHYSICAL THERAPY TREATMENT NOTE         Certification      From:  04/23/2014            Through:   05/23/14  ONSET DATE: 05/02/2013  START OF CARE DATE: 02/13/2014    VISITS FROM SOC: 9      INTERPRETER STATUS (Not needed, Telephonic, In Person):No   Referring Provider: Burna SisMichael E Brage     Diagnosis:   Encounter Diagnoses    Name  Primary?    .  Other physical therapy  Yes    .  Right foot pain         PRECAUTIONS: continue progressive weightbearing. WBAT in CAM boot, and wean walker and boot as tolerated. Per orthopedics 03/07/2014     Mechanism of Injury: fell 10 feet into hole      sp right ORIF for a open talotarsal dislocation, midtarsal arthrodesis and gastroc recession performed on 11/01/13       SUBJECTIVE:  [name] Statement: Good progress with working on walking in both shoes---Cristian Baker reports he is doing everything and is able to do more than he has before.  He estimates feeling about 65% recovered.        OBJECTIVE MEASURES / IMPAIRMENTS:  AROM R ankle: PF=35 deg; PROM with stretch=40 deg  TC joint mobility: anterior glide of talus=limited  SL balance: 10 sec with hip versus ankle strategy  STRENGTH: R gastroc=3/5      TREATMENT & EDUCATION:      97110 Therapeutic exercise for 35 minutes    Parallel bars: gait with regular shoes  --instruction in equal stride length, heel strike and use of forefoot at terminal stance  SL balance on flat  DL narrow base ankle sways in sagittal plane  DL gastroc strengthening  4" step touch downs with L foot--cues for keeping heel down on R    97116 Gait Training  Ambulation in hallway x 250' with cues for equal stride and use of forefoot at terminal stance  Varying speed with ambulation providing the same cues                        [name] learning needs, abilities, preferences and readiness per Initial POC were considered in this session.  Learning verified by teach back.      Time in: 0913  Total Treatment Minutes:   45   Total Timed Code Minutes:  45      Home Exercise  Program   Sitting: use hands to stretch and massage scars  --move foot/ankle to stretch all directions  --use belt/towel to stretch back of calf   Walking: wear regular shoes now at all times--practice equal sized steps and using heel and toes  --walk backward  R foot Inv and Ever using green band  B heel raises--put more weight onto R as able to tolerate  Practice pushing off of back foot in stance like you are punching a jab      a handout was provided describing the exercises in written and picture format     ASSESSMENT  :  Pt. Presented with a regular pair of shoes today.  He was able to demonstrate most exercises correctly in the clinic.  He lacks R ankle plantarflexion which hinders his ability to take equal strides and to use his forefoot at terminal stance as required in a normal gait pattern.  PT is required to provide him with this instruction as  he cannot determine his faults in his gait pattern on his own.  He continues to wear his CAM boot as he states fear of someone stepping on his foot and of reinjury.  PT explained that he needs to use regular shoes now to assist in working on his gait pattern and strengthening.  He acknowledged understanding. We recommend follow up in 2 weeks.           PLAN:   Plan for next visit:  Recheck status, plan of care review, progress strengthening, proprioception and functional gait, may try treadmill         JaneTalcott, PT, DPT  [facility]  [facility]  Phone: [phone]   FAX: [phone]

## 2014-05-23 ENCOUNTER — Ambulatory Visit (HOSPITAL_BASED_OUTPATIENT_CLINIC_OR_DEPARTMENT_OTHER): Payer: 59 | Attending: Orthopedic Surgery | Admitting: Orthopedic Surgery

## 2014-05-23 VITALS — BP 126/74 | HR 70 | Ht 68.0 in | Wt 218.0 lb

## 2014-05-23 DIAGNOSIS — R52 Pain, unspecified: Secondary | ICD-10-CM

## 2014-05-23 DIAGNOSIS — S93304D Unspecified dislocation of right foot, subsequent encounter: Secondary | ICD-10-CM | POA: Insufficient documentation

## 2014-05-23 NOTE — Patient Instructions (Signed)
Thanks for seeing us today

## 2014-05-23 NOTE — Progress Notes (Signed)
See dictated note.  Confirm number [id number]

## 2014-05-24 NOTE — Progress Notes (Signed)
Cristian Baker, Cristian Baker          [id number]          05/23/2014      CHIEF COMPLAINT     Followup for his right foot.  He had a midtarsal arthrodesis with reduction of a dislocated transverse tarsal joint done on 11/01/2013.    HISTORY     Mr. Cristian Baker is now some six months and more from surgery.  He presents for routine evaluation.  He wore his CAM boot only for extra protection today.  Otherwise, he is in a normal shoe.  He does say he has a 5/10 pain, and it is an intermittent stinging pain on the medial side of his foot over the talonavicular joint area.  The patient does get this fairly often, but it seems to be lessening.  He notes mild swelling and stiffness; no numbness or catching.  It does not feel weak.  Overall, he says he is happy.  Symptoms are worsened with use, and resting does make it feel a little bit better.    PAST MEDICAL AND SURGICAL HISTORY     Unchanged.    MEDICATIONS     No changes in medication.    FAMILY HISTORY     No changes in family history.    DRUG ALLERGIES     No known allergies to drugs, although he has allergy symptoms, including runny nose and sometimes sinus problems.    SOCIAL HISTORY     He does not smoke.  He does not consume alcohol.    REVIEW OF SYSTEMS     Reveals the runny nose that he indicated earlier, and he has some cold intolerance.  Otherwise, it is negative for 10 systems reviewed and recorded on a separate sheet of paper.    PHYSICAL EXAMINATION     VITAL SIGNS:  He is 5 feet 8 inches, 218 pounds.  His blood pressure is 126/74, pulse of 70, respiration rate of 15.  GENERAL:  He is a well-developed, well-nourished, alert and oriented x3, sitting comfortably on the exam chair.  MUSCULOSKELETAL:  His foot is well-healed and nicely aligned.  Mildly swollen with no erythema or bruising.  He has normal pulses, skin, and sensation.  His tibiotalar joint was palpated.  It ranges fully and normally without pain.  He has no hindfoot motion at all, and nontender to fairly  vigorous palpation over the midtarsus, although medially is where he says he has this pain on occasion.  The patient's tendons are intact, they are nontender, and he has 5/5 strength in all directions.  His ankle is stable.    IMAGING     X-rays were reviewed with the patient present, three views of the foot, AP, oblique, and lateral.  He has retained implants, a well-healed midtarsal fusion with screw and plate construct.  There has been no hardware failure.  The alignment is acceptable.    ASSESSMENT     Status post midtarsal arthrodesis, status post reduction of a transverse tarsal joint dislocation.    PLAN     The plan is to see him as needed.  We answered all his questions today.  We encouraged him to do normal activities.  We discussed hardware removal in the future, and answered all his questions.

## 2014-06-05 ENCOUNTER — Ambulatory Visit (HOSPITAL_BASED_OUTPATIENT_CLINIC_OR_DEPARTMENT_OTHER): Payer: 59 | Admitting: Rehabilitative and Restorative Service Providers"

## 2014-06-05 DIAGNOSIS — M79671 Pain in right foot: Secondary | ICD-10-CM

## 2014-06-05 DIAGNOSIS — S92251S Displaced fracture of navicular [scaphoid] of right foot, sequela: Secondary | ICD-10-CM

## 2014-06-05 NOTE — Progress Notes (Signed)
PHYSICAL THERAPY PLAN OF CARE REVIEW          Certification      From:  06/05/2014            Through:   07/05/14  ONSET DATE: 05/02/2013  START OF CARE DATE: 02/13/2014    VISITS FROM SOC: 10    INTERPRETER STATUS (Not needed, Telephonic, In Person):No   Referring Provider: Bartholome Bill     Diagnosis:   Encounter Diagnoses    Name  Primary?    .  Other physical therapy  Yes    .  Right foot pain         PRECAUTIONS: continue progressive weightbearing. WBAT in CAM boot, and wean walker and boot as tolerated. Per orthopedics 03/07/2014     Mechanism of Injury: fell 10 feet into hole      sp right ORIF for a open talotarsal dislocation, midtarsal arthrodesis and gastroc recession performed on 11/01/13     SUBJECTIVE:  [name] Statement: Pt. Reports he has had more soreness lately as he thinks he is walking too much.  He goes out every day and walks multiple blocks using his cane but sometimes working on walking without it as well.      OBJECTIVE MEASURES / IMPAIRMENTS:  AROM R ankle: PF=40 deg; PROM with stretch=48 deg  TC joint mobility: anterior glide of talus=limited  STRENGTH: R gastroc=3/5    TREATMENT & EDUCATION:    97110 Therapeutic exercise for 40 minutes                    Plan of Care Review  Standing: gastroc stretching  Parallel bars: gait with regular shoes  --instruction in equal stride length, heel strike and use of forefoot at terminal stance  SL balance on flat  DL and SL balance on foam pad  DL narrow base ankle sways in sagittal plane  DL gastroc strengthening  --soleus strengthening on foam pad  4" step touch downs with L foot--cues for keeping heel down on R  Side stepping and backward walking with cues for equal strides    97116 Gait Training for 5 minutes  Ambulation in hallway x 150' with cues for equal stride and use of medial forefoot at terminal stance--no cane  Varying speed with ambulation providing the same cues      [name] learning needs, abilities, preferences and readiness per  Initial POC were considered in this session.  Learning verified by teach back.      Time in:   0947  Total Treatment Minutes:   45   Total Timed Code Minutes:  45      Home Exercise Program   Sitting: use hands to stretch and massage scars  --move foot/ankle to stretch all directions  --use belt/towel to stretch back of calf   Walking: wear regular shoes now at all times--practice equal sized steps and using heel and toes  --walk backward, sideways  --walking be sure your big toe side of your foot is helping  R foot Inv and Ever using green band  B heel raises--put more weight onto R as able to tolerate--GOAL: to raise up on R by itself  Practice pushing off of back foot in stance like you are punching a jab        a handout was provided describing the exercises in written     ASSESSMENT  :    Cristian Baker is progressing steadily and now is working on his  range of motion, strengthening and balance as well as normalizing his gait pattern.  He continues to be limited with his use of the medial aspect of the forefoot due to residual pain.  PT is working on guiding him through appropriate exercise which he needs direct 1:1 instruction to perform correctly.  He will need further progression and is performing his home program well. He recently has been cleared by his orthopedic MD to progress.     Outcomes Score: CareConnections: Functional Index Score  Incomplete form today   Primary Functional Assessment Tool for G Coding:   lower extremity TAOS       PLAN:   Treatment Plan : Patient will be seen for 3 visits per month   until plan of care reviewed.    Plan for next visit:   Recheck status, plan of care review, progress strengthening, proprioception and functional gait, may try treadmill     Therapy will include therapeutic exercise, therapeutic activities, neuromuscular reeducation and gait training       CURRENT LEVEL OF FUNCTION:  1. Able to walk and be up on feet 40% of the day  2. Able to walk down hill easier than  up--has to use cane  3. Able to walk up stairs reciprocally     Prior Level of Function Short Term Goals for Current Certification Period  Status MET/UNMET    Date Updated:  04/23/2014  Date to be Reviewed:  05/23/14  05/23/14    Unable to walk with regular shoe Pt. Will wear regular shoe for all needs walking  GOAL MET   Limited R ankle plantarflexion needed for normalizing gait Pt. Will improve R ankle plantarflexion > 40 deg as needed for more normal gait pattern  GOAL MET       DISCHARGE GOALS - Anticipated Discharge Date 08/04/2014 (extended)  1. Pt. Will be able to walk over all terrains without use of assistive device  2. Pt. Will be able to walk for exercise as needed  3.     Prior Level of Function   Short Term Goals for Current Certification Period Status MET/UNMET   Date Updated:  06/05/2014 Date to be Reviewed:  07/05/14 07/05/14   Limited tolerance for walking during daytime, i.e. Has to sit frequently with walking on streets of [place]   Pt. Will report ability to walk as needed in streets of [place] with only occasional need to stop and rest    Unable to jog or walk quickly short distances as needed to catch a bus   Pt. Will be tolerant of jogging or running x 30' as needed to catch a bus          Miquel Dunn, [place], DPT  [facility]  [facility]  Phone: [phone]   FAX: [phone]

## 2014-06-19 ENCOUNTER — Ambulatory Visit (HOSPITAL_BASED_OUTPATIENT_CLINIC_OR_DEPARTMENT_OTHER): Payer: 59 | Attending: Orthopedic Surgery | Admitting: Rehabilitative and Restorative Service Providers"

## 2014-06-19 DIAGNOSIS — M79671 Pain in right foot: Secondary | ICD-10-CM | POA: Insufficient documentation

## 2014-06-19 DIAGNOSIS — S92251S Displaced fracture of navicular [scaphoid] of right foot, sequela: Secondary | ICD-10-CM | POA: Insufficient documentation

## 2014-06-19 NOTE — Progress Notes (Signed)
PHYSICAL THERAPY TREATMENT NOTE         Certification      From:  06/05/2014            Through:   07/05/14  ONSET DATE: 05/02/2013  START OF CARE DATE: 02/13/2014    VISITS FROM SOC: 11    INTERPRETER STATUS (Not needed, Telephonic, In Person):No   Referring Provider: Burna SisMichael E Brage     Diagnosis:   Encounter Diagnoses    Name  Primary?    .  Other physical therapy  Yes    .  Right foot pain         PRECAUTIONS: continue progressive weightbearing. WBAT in CAM boot, and wean walker and boot as tolerated. Per orthopedics 03/07/2014     Mechanism of Injury: fell 10 feet into hole      sp right ORIF for a open talotarsal dislocation, midtarsal arthrodesis and gastroc recession performed on 11/01/13     SUBJECTIVE:  [name] Statement: The pain is not as great as it was.  I am still walking for exercise, I can walk indoors without the cane now.     OBJECTIVE MEASURES / IMPAIRMENTS:  STRENGTH: R gastroc=3/5 with ability to SL heel raise x 3    TREATMENT & EDUCATION:    97110 Therapeutic exercise for 30 minutes                    Parallel bars: gait with regular shoes  Standing gastroc, tibialis anterior stretching  --instruction in equal stride length, heel strike and use of forefoot at terminal stance  SL balance on flat  DL and SL balance on foam pad  DL narrow base ankle sways in sagittal plane  DL gastroc strengthening  --soleus strengthening on foam pad  6" step touch downs with L foot--cues for keeping heel down on R  Side stepping and backward walking with cues for equal strides    97116 Gait Training for 5 minutes  Ambulation in hallway x 150' with cues for equal stride and use of medial forefoot at terminal stance--no cane  Varying speed with ambulation providing the same cues      [name] learning needs, abilities, preferences and readiness per Initial POC were considered in this session.  Learning verified by teach back.      Time in: 1040  Total Treatment Minutes:  35  Total Timed Code Minutes:  35      Home  Exercise Program   Sitting: use hands to stretch and massage scars  --move foot/ankle to stretch all directions  --use belt/towel to stretch back of calf   Walking: wear regular shoes now at all times--practice equal sized steps and using heel and toes  --walk backward, sideways  --walking be sure your big toe side of your foot is helping  R foot Inv and Ever using green band  B heel raises--put more weight onto R as able to tolerate--GOAL: to raise up on R by itself  Practice pushing off of back foot in stance like you are punching a jab        a handout was provided describing the exercises in written     ASSESSMENT  :   Good follow through with all exercises, however functionally Casimiro NeedleMichael requires reinstruction in his gait pattern issues.  He responds well to instruction but will benefit from further progression.        PLAN:   Plan for next visit:   Progress strengthening  and functional gait training        Dayna RamusJane Talcott, PT, DPT  [facility]  [facility]  Phone: [phone]   FAX: [phone]

## 2014-07-01 ENCOUNTER — Encounter (HOSPITAL_BASED_OUTPATIENT_CLINIC_OR_DEPARTMENT_OTHER): Payer: 59 | Admitting: Rehabilitative and Restorative Service Providers"

## 2014-07-01 ENCOUNTER — Telehealth (HOSPITAL_BASED_OUTPATIENT_CLINIC_OR_DEPARTMENT_OTHER): Payer: Self-pay | Admitting: Rehabilitative and Restorative Service Providers"

## 2014-07-01 NOTE — Telephone Encounter (Signed)
CONFIRMED PHONE NUMBER: [phone]  CALLERS FIRST AND LAST NAME: Cristian BundeMichael Eugene Baker  FACILITY NAME: na TITLE: na  CALLERS RELATIONSHIP:Self  RETURN CALL: No call back needed     SUBJECT: Cancellation/Reschedule   REASON FOR CANCELLATION: Sick  RESCHEDULED: YES, Date 07/03/14, Time 2:00 pm

## 2014-07-03 ENCOUNTER — Ambulatory Visit (HOSPITAL_BASED_OUTPATIENT_CLINIC_OR_DEPARTMENT_OTHER): Payer: 59 | Admitting: Rehabilitative and Restorative Service Providers"

## 2014-07-03 DIAGNOSIS — M79671 Pain in right foot: Secondary | ICD-10-CM

## 2014-07-03 DIAGNOSIS — S92251S Displaced fracture of navicular [scaphoid] of right foot, sequela: Secondary | ICD-10-CM

## 2014-07-03 NOTE — Progress Notes (Signed)
PHYSICAL THERAPY TREATMENT NOTE         Certification      From:  06/05/2014            Through:   07/05/14  ONSET DATE: 05/02/2013  START OF CARE DATE: 02/13/2014    VISITS FROM SOC: 12    INTERPRETER STATUS (Not needed, Telephonic, In Person):No   Referring Provider: Burna SisMichael E Brage     Diagnosis:   Encounter Diagnoses    Name  Primary?    .  Other physical therapy  Yes    .  Right foot pain         PRECAUTIONS: none    Mechanism of Injury: fell 10 feet into hole      s/p right ORIF for a open talotarsal dislocation, midtarsal arthrodesis and gastroc recession performed on 11/01/13     SUBJECTIVE:  [name] Statement: Pt. Reports pain continues to improve gradually.  He is doing his exercises and can tell he is slowly making progress.      OBJECTIVE MEASURES / IMPAIRMENTS:  STRENGTH: R gastroc=3/5 with ability to SL heel raise x 5  BALANCE: SL R=30 sec with ankle strategy flat level--compliant surface=10 sec with hip strategy  ROM: R ankle PF=40 deg with stretching    TREATMENT & EDUCATION:    97110 Therapeutic exercise for 30 minutes                    Parallel bars: gait without shoes (pt. Requested so he can "feel things better")  Standing gastroc, tibialis anterior stretching  --instruction in equal stride length, heel strike and use of forefoot at terminal stance  SL balance on flat  DL and SL balance on foam pad  DL narrow base ankle sways in sagittal plane  DL gastroc strengthening  --soleus strengthening on foam pad  6" step touch downs with L foot--cues for keeping heel down on R  Side stepping and backward walking with cues for equal strides    97116 Gait Training for 15 minutes  Ambulation in hallway x 150' with cues for equal stride and use of medial forefoot at terminal stance--no cane  Instruction in longer stride with L forward      [name] learning needs, abilities, preferences and readiness per Initial POC were considered in this session.  Learning verified by teach back.      Time in: 1400  Total  Treatment Minutes:  45  Total Timed Code Minutes:  45      Home Exercise Program   Sitting: use hands to stretch and massage scars  Walking: wear regular shoes now at all times--practice equal sized steps and using heel and toes  --walk backward, sideways  --walking be sure your big toe side of your foot is helping  R foot Inv and Ever using green band  Heel raises--work on increasing # reps on R only  Practice pushing off of back foot in stance like you are punching a jab    Previously a handout was provided describing the exercises in written     ASSESSMENT  :  Progress is slow and steady.  Cristian Baker continues to verbalize motivation in his willingness to do exercises.  He wants to continue on a limited basis to have a check in regarding his progress.  PT reported we will request 2 monthly visits.        PLAN:   Plan for next visit:   Progress strengthening and functional gait training--working  toward independence without need for cane on outdoor terrains        Cristian Baker, [place], DPT  [facility]  [facility]  Phone: [phone]   FAX: [phone]

## 2014-07-29 ENCOUNTER — Telehealth (HOSPITAL_BASED_OUTPATIENT_CLINIC_OR_DEPARTMENT_OTHER): Payer: Self-pay | Admitting: Rehabilitative and Restorative Service Providers"

## 2014-07-29 ENCOUNTER — Encounter (HOSPITAL_BASED_OUTPATIENT_CLINIC_OR_DEPARTMENT_OTHER): Payer: 59 | Admitting: Rehabilitative and Restorative Service Providers"

## 2014-07-29 NOTE — Telephone Encounter (Signed)
Patient calling in again to get rescheduled. CCR let him know new referral needed to be sent.  Patient stated he doesn't have current PCP.  Please call patient to advise.

## 2014-07-29 NOTE — Telephone Encounter (Signed)
CONFIRMED PHONE NUMBER: [phone]  CALLERS FIRST AND LAST NAME: Cristian BundeMichael Eugene Baker  FACILITY NAME: na TITLE: na  CALLERS RELATIONSHIP:Self  RETURN CALL: Detailed message on voicemail only     SUBJECT: Appointment Request   REASON FOR REQUEST: Patient called to reschedule the appointment that he has for 07-29-2014. CCR reviewed referral and it shows that referral is only authorized to today. Please call thank you to reschedule and discuss referral if needed.     REQUEST APPOINTMENT WITH: Francoise SchaumannJane Talcot  REFERRING PROVIDER: Dr. Griffin BasilBrage   REQUESTED DATE: open  REQUESTED TIME: open  UNABLE TO APPOINT: Other: Above

## 2014-09-04 ENCOUNTER — Ambulatory Visit (HOSPITAL_BASED_OUTPATIENT_CLINIC_OR_DEPARTMENT_OTHER): Payer: 59 | Attending: Orthopedic Surgery | Admitting: Rehabilitative and Restorative Service Providers"

## 2014-09-04 DIAGNOSIS — M79671 Pain in right foot: Secondary | ICD-10-CM | POA: Insufficient documentation

## 2014-09-04 NOTE — Progress Notes (Signed)
PHYSICAL THERAPY DISCHARGE          Certification      From:  09/04/2014            Through:   10/04/14    ONSET DATE: 05/02/2013  START OF CARE DATE: 02/13/2014    VISITS FROM SOC: 13    INTERPRETER STATUS (Not needed, Telephonic, In Person):No   Referring Provider: Bartholome Bill     Diagnosis:   Encounter Diagnoses    Name  Primary?    .  Other physical therapy  Yes    .  Right foot pain         PRECAUTIONS: none    Mechanism of Injury: fell 10 feet into hole      s/p right ORIF for a open talotarsal dislocation, midtarsal arthrodesis and gastroc recession performed on 11/01/13       SUBJECTIVE:  [name] Statement: Pt. Reports he feels about 40% in terms of his overall recovery due stiffness mostly.  He feels like his balance has improved.  He now has a routine of stretching and exercise.      OBJECTIVE MEASURES / IMPAIRMENTS:  PAIN: 3 constant  STRENGTH: R gastroc=3/5 with ability to SL heel raise x 5  ROM: R ankle PF=40 deg with stretching  FUNCTIONAL: stairs: able with minimal guarding                           Squat: able with both legs as needed to reach the floor                           [facility]: able to walk up and down all without difficulty      TREATMENT & EDUCATION:                   97110 Therapeutic exercise for 30 minutes    Recheck of status--see objective above  Home program review--emphasis with pt. On need for plantarflexion stretching, gastroc strengthening, gait training and balance  Functional: pt. Able to walk varying speeds, bend down, up and down hills as well as stairs now    [name] learning needs, abilities, preferences and readiness per Initial POC were considered in this session.  Learning verified by teach back.      Time in:   0830  Total Treatment Minutes:   30   Total Timed Code Minutes:  30      Home Exercise Program   Walking: -practice equal sized steps and using heel and toes  --walk backward, sideways  --walking be sure your big toe side of your foot is helping  R foot Inv and  Ever using green band  Heel raises--work on increasing # reps on R only--try with back to wall  Practice pushing off of back foot in stance like you are punching a jab  Balance on R foot only    Previously a handout was provided describing the exercises in written     ASSESSMENT  :    Rolf appears to have reached a plateau currently in his recovery.  He still lacks plantarflexion and gastroc strength.  We have instructed him in appropriate areas to focus and he is happy with his program.  He states he knows he will get it back, but it will take some time.  He is performing the exercises he can as he can.  Follow up with  per primary care provider, but pt. Does not have one.  We discussed his need for one and he states he will go to the [facility] clinic if needed.      Outcomes Score: CareConnections: Functional Index Score  41   Primary Functional Assessment Tool for G Coding:   lower extremity TAOS       PLAN:   Treatment Plan : Discharge     CURRENT LEVEL OF FUNCTION:  1.  Able to walk over all terrains without assistive device--minimal antalgic gait pattern noted  2.  Unable to perform heel raises with out use of left foot to assist  3.  Single leg balance is good static---dynamic is limited <10 sec    DISCHARGE GOALS - Anticipated Discharge Date 08/04/2014 (extended)  1. Pt. Will be able to walk over all terrains without use of assistive device--GOAL MET  2. Pt. Will be able to walk for exercise as needed--GOAL MET  3.   Prior Level of Function Short Term Goals for Current Certification Period  Status MET/UNMET    Date Updated:  06/05/2014  Date  Reviewed:  09/04/2014  07/05/14   09/04/2014   Limited tolerance for walking during daytime, i.e. Has to sit frequently with walking on streets of [place] Pt. Will report ability to walk as needed in streets of [place] with only occasional need to stop and rest       GOAL MET   Unable to jog or walk quickly short distances as needed to catch a bus Pt. Will be tolerant  of jogging or running x 30' as needed to catch a bus                GOAL MET             Miquel Dunn, PT, DPT  [facility] [facility]  [facility]  Phone: [phone]   FAX: [phone]

## 2014-11-01 ENCOUNTER — Emergency Department (HOSPITAL_BASED_OUTPATIENT_CLINIC_OR_DEPARTMENT_OTHER)
Admission: EM | Admit: 2014-11-01 | Discharge: 2014-11-01 | Disposition: A | Discharge status: Home / Self Care | Payer: 59 | Attending: Emergency Medicine | Admitting: Emergency Medicine

## 2014-11-01 DIAGNOSIS — R21 Rash and other nonspecific skin eruption: Secondary | ICD-10-CM

## 2014-11-01 LAB — GLUCOSE POC, HMC: Glucose (POC): 95 mg/dL (ref 62–125)

## 2015-02-13 ENCOUNTER — Other Ambulatory Visit: Payer: Self-pay

## 2015-02-13 ENCOUNTER — Ambulatory Visit (HOSPITAL_BASED_OUTPATIENT_CLINIC_OR_DEPARTMENT_OTHER)
Admit: 2015-02-13 | Discharge: 2015-02-13 | Disposition: A | Discharge status: Home / Self Care | Payer: 59 | Attending: Internal Medicine | Admitting: Internal Medicine

## 2015-02-13 DIAGNOSIS — M4120 Other idiopathic scoliosis, site unspecified: Secondary | ICD-10-CM

## 2015-03-19 ENCOUNTER — Other Ambulatory Visit (HOSPITAL_BASED_OUTPATIENT_CLINIC_OR_DEPARTMENT_OTHER): Payer: Self-pay | Admitting: Orthopaedic Surgery

## 2015-03-19 DIAGNOSIS — M79671 Pain in right foot: Secondary | ICD-10-CM

## 2015-03-28 ENCOUNTER — Encounter (HOSPITAL_BASED_OUTPATIENT_CLINIC_OR_DEPARTMENT_OTHER): Payer: Self-pay | Admitting: Orthopedic Surgery

## 2015-03-28 ENCOUNTER — Ambulatory Visit (HOSPITAL_BASED_OUTPATIENT_CLINIC_OR_DEPARTMENT_OTHER): Payer: 59 | Attending: Orthopaedic Surgery | Admitting: Orthopedic Surgery

## 2015-03-28 VITALS — BP 137/83 | HR 53 | Resp 18 | Ht 68.0 in | Wt 223.0 lb

## 2015-03-28 DIAGNOSIS — M19079 Primary osteoarthritis, unspecified ankle and foot: Secondary | ICD-10-CM

## 2015-03-28 DIAGNOSIS — T8584XA Pain due to internal prosthetic devices, implants and grafts, not elsewhere classified, initial encounter: Secondary | ICD-10-CM | POA: Insufficient documentation

## 2015-03-28 DIAGNOSIS — M79671 Pain in right foot: Secondary | ICD-10-CM | POA: Insufficient documentation

## 2015-03-28 DIAGNOSIS — T85848A Pain due to other internal prosthetic devices, implants and grafts, initial encounter: Secondary | ICD-10-CM | POA: Insufficient documentation

## 2015-03-28 DIAGNOSIS — M1990 Unspecified osteoarthritis, unspecified site: Secondary | ICD-10-CM | POA: Insufficient documentation

## 2015-03-28 NOTE — Progress Notes (Signed)
Patient Name: Cristian Baker  Date of Birth: 1961-05-19  Medical Record #: [id number]    Primary Care Provider:  Rexene Alberts, MD  [facility] [address]  [place], [place] [address]      Diagnoses:  Dislocation of transverse tarsal joint    Date of Surgery:  11/01/2013 - (Brage)  Right midtarsal arthrodesis for reduction of dislocated transverse tarsal joint    Identification:  Cristian Baker is a 54 year old year old male who sustained the above injury 1.5 years ago when he hyperplantarflexed his foot; and underwent the above surgery. Cristian Baker was last seen in clinic 05/23/14 at which time he was stable and encouraged to return to full activity.    Interval Events:  3/10 pain  Overall doing well  He is wondering if he can have his hardware out   He denies symptomatic hardware  Denies fevers, chills, nausea  Mild swelling which has improved over time.    Review of Systems:  A complete review of systems was performed at the clinic visit. Positives include those noted in the HPI. All other systems are negative.    Past Medical History, Past Surgical History, Family History, Social History:   Unchanged from prior visits.    Medications:  Outpatient Prescriptions Prior to Visit   Medication Sig Dispense Refill   . Ascorbic Acid (VITAMIN C) 500 MG Oral Cap Take 500 mg by mouth daily. Patient sometimes increases to 1000 mg per PRN     . Aspirin 81 MG Oral Tab EC Take 1 tablet by mouth daily 30 Tab 3   . Ergocalciferol 50000 UNIT Oral Cap take 1 capsule every Friday for the next 8 weeks 8 Cap 0   . FISH OIL  Take 1,000 mg by mouth daily.     . Ondansetron 4 MG Oral TABLET DISPERSIBLE None Entered     . OxyCODONE HCl 5 MG Oral Cap Take 5 mg by mouth daily.     . Simvastatin 20 MG Oral Tab Take 2 tablet by mouth every evening 30 Tab 3   . Warfarin Sodium 7.5 MG Oral Tab Take 12.5 mg by mouth every other day.        No facility-administered medications prior to visit.       Allergies:  Review of patient's  allergies indicates:  No Known Allergies    Physical Exam:  Filed Vitals:    03/28/15 0814   BP: 137/83   Pulse: 53   Resp: 18   Height:  (1.727 m)   Weight: 223 lb (101.152 kg)     General: well appearing, no apparent distress, sitting comfortably in exam table  Mental Status: alert and oriented to person, place, time, situation  Respiratory: symmetric chest expansion, no acute resp distress, no audible wheezing    Musculoskeletal:    Right Lower Extremity Musculoskeletal Exam:  -Inspection: incisions healed  -Palpation: nontender to manipulation of foot  -Sensory: 1/2 deep peroneal, 1/2 superficial peroneal, 2/2 tibial, 2/2 sural, 2/2 saphenous.  -Motor: 5/5 hip flexors, 5/5 quadriceps, 5/5 extensor hallucis longus, 5/5 flexor hallucis longus, 5/5 ankle inversion, 5/5 ankle eversion  -Vascular: Warm and well perfused, 2+ dorsalis pedis artery, 1+ posterior tibial artery    Imaging:  X-rays: Plain films from today were reviewed and show no hardware failure. No change in foot alignment.  Other Imaging Studies: none    Other Diagnostic Tests: none  Laboratory: none    Assessment:  Overall doing well 1.5 years after midfoot arthrodesis  Painful orthopaedic hardware    All of the clinical and radiographic findings were reviewed with the patient. They showed good understanding, asked appropriate questions which were all answered and are in agreement with the treatments and recommendations provided today.    The risks, benefits, and alternatives to the procedure were discussed including but not limited to risks of anesthesia (myocardial infarction, pulmonary embolism, deep vein thrombosis, allergic reaction, death) and surgery (infection, re-injury, damage to surrounding nerves, tendons, vasculature, and soft tissue, need for further surgery). All of the patient's questions were answered.    Consent signed for:  Removal of hardware, right foot    Plan:  Surgery as above  Outpatient surgery, he lives in shelter but  should be able to have surgery      Procedures scheduled/performed: Removal of hardware, right foot  Pain management: non-opioid  Therapy/motion: none  Follow-up: at surgery  Imaging at next appointment: none      This patient was seen and evaluated with Dr. Griffin Basil.    Gala Romney, MD  Resident Physician  [facility]   Dept. of Orthopaedics & Sports Medicine

## 2015-03-28 NOTE — Progress Notes (Signed)
ASSESSMENT:    Primary Learner patient  Patient Challenges to Learning none   Challenges Impacting this Teaching Session none  Interpreter used no    POST-OP RECOMMENDED EQUIPMENT:  Bilat procedure: no  Mobility Devices: no  Bathroom Equipment: no    PRE-OP EDUCATION:  Pre-surgery Instructions:   Bring equipment to hospital  Cleanse operative site with 2% chlorohexidine wipes    Discharge and Recovery Instructions: Call clinic at [phone] should you have any questions/concerns.  Call ortho pharmacy at [phone] for any medication questions. (i.e. Side effects, ineffective pain management, refill)     Post-op Appointment: given to patient    EDUCATION:    One-on-one teaching with patient  Foot and Ankle Handbook provided to pt   Post education response patient states he understands instructions and received NPO guidelines  Time spent teaching:  10 mins

## 2015-03-28 NOTE — Patient Instructions (Signed)
Thanks for seeing us today

## 2015-03-28 NOTE — Progress Notes (Signed)
I saw and evaluated the patient. I have reviewed the resident's documentation and agree with it.

## 2015-03-31 ENCOUNTER — Other Ambulatory Visit (HOSPITAL_BASED_OUTPATIENT_CLINIC_OR_DEPARTMENT_OTHER): Payer: Self-pay

## 2015-03-31 ENCOUNTER — Ambulatory Visit (HOSPITAL_BASED_OUTPATIENT_CLINIC_OR_DEPARTMENT_OTHER): Payer: 59 | Attending: Orthopedic Surgery

## 2015-03-31 DIAGNOSIS — Z01818 Encounter for other preprocedural examination: Secondary | ICD-10-CM | POA: Insufficient documentation

## 2015-04-02 LAB — R/O MRSA

## 2015-04-24 ENCOUNTER — Ambulatory Visit: Payer: Self-pay

## 2015-04-24 NOTE — Telephone Encounter (Signed)
Protocol: NO CONTACT OR DUPLICATE CONTACT CALL-ADULT-AH  Affirmative: Message left on identified answering machine.  Disposition of No Contact Calls  suggested.

## 2015-04-24 NOTE — Telephone Encounter (Signed)
-----   Message from [place] Bourdeau sent at 04/24/2015  4:50 PM PDT -----  Regarding: Needs to know what time surgery is on Monday 9/12, not listed in scheduling.  >> CHARLOTTE BOURDEAU 04/24/2015 04:50 PM  Needs to know what time surgery is on Monday 9/12, not listed in scheduling.

## 2015-04-28 ENCOUNTER — Ambulatory Visit (HOSPITAL_COMMUNITY): Payer: Self-pay | Admitting: Orthopedic Surgery

## 2015-04-28 ENCOUNTER — Ambulatory Visit (HOSPITAL_BASED_OUTPATIENT_CLINIC_OR_DEPARTMENT_OTHER)
Admission: RE | Admit: 2015-04-28 | Discharge: 2015-04-28 | Disposition: A | Discharge status: Home / Self Care | Payer: 59 | Attending: Orthopedic Surgery | Admitting: Orthopedic Surgery

## 2015-04-28 DIAGNOSIS — Z5329 Procedure and treatment not carried out because of patient's decision for other reasons: Secondary | ICD-10-CM | POA: Insufficient documentation

## 2015-04-28 DIAGNOSIS — X58XXXA Exposure to other specified factors, initial encounter: Secondary | ICD-10-CM | POA: Insufficient documentation

## 2015-04-28 DIAGNOSIS — T84498A Other mechanical complication of other internal orthopedic devices, implants and grafts, initial encounter: Secondary | ICD-10-CM | POA: Insufficient documentation

## 2015-05-08 ENCOUNTER — Other Ambulatory Visit (HOSPITAL_BASED_OUTPATIENT_CLINIC_OR_DEPARTMENT_OTHER): Payer: Self-pay | Admitting: Adult Health

## 2015-05-14 ENCOUNTER — Encounter (HOSPITAL_BASED_OUTPATIENT_CLINIC_OR_DEPARTMENT_OTHER): Payer: 59 | Admitting: Adult Health

## 2015-05-28 ENCOUNTER — Other Ambulatory Visit (HOSPITAL_BASED_OUTPATIENT_CLINIC_OR_DEPARTMENT_OTHER): Payer: Self-pay | Admitting: Orthopaedic Surgery

## 2015-05-28 DIAGNOSIS — Z9889 Other specified postprocedural states: Secondary | ICD-10-CM

## 2015-06-06 ENCOUNTER — Encounter (HOSPITAL_BASED_OUTPATIENT_CLINIC_OR_DEPARTMENT_OTHER): Payer: 59 | Admitting: Orthopedic Surgery

## 2015-09-22 ENCOUNTER — Emergency Department (HOSPITAL_COMMUNITY): Payer: Medicaid - Out of State

## 2015-09-22 ENCOUNTER — Emergency Department (HOSPITAL_COMMUNITY): Payer: Medicaid - Out of State | Admitting: Certified Registered Nurse Anesthetist

## 2015-09-22 ENCOUNTER — Observation Stay (HOSPITAL_COMMUNITY)
Admission: EM | Admit: 2015-09-22 | Discharge: 2015-09-24 | Disposition: A | Discharge status: Home / Self Care | Payer: Medicaid - Out of State | Attending: General Surgery | Admitting: General Surgery

## 2015-09-22 ENCOUNTER — Encounter (HOSPITAL_COMMUNITY): Payer: Self-pay | Admitting: Emergency Medicine

## 2015-09-22 ENCOUNTER — Encounter (HOSPITAL_COMMUNITY)
Admission: EM | Disposition: A | Discharge status: Home / Self Care | Payer: Self-pay | Source: Home / Self Care | Attending: Emergency Medicine

## 2015-09-22 DIAGNOSIS — K353 Acute appendicitis with localized peritonitis: Secondary | ICD-10-CM | POA: Diagnosis not present

## 2015-09-22 DIAGNOSIS — Z87891 Personal history of nicotine dependence: Secondary | ICD-10-CM | POA: Insufficient documentation

## 2015-09-22 DIAGNOSIS — K429 Umbilical hernia without obstruction or gangrene: Secondary | ICD-10-CM | POA: Diagnosis not present

## 2015-09-22 DIAGNOSIS — K3580 Unspecified acute appendicitis: Secondary | ICD-10-CM | POA: Diagnosis present

## 2015-09-22 DIAGNOSIS — Z86718 Personal history of other venous thrombosis and embolism: Secondary | ICD-10-CM | POA: Insufficient documentation

## 2015-09-22 HISTORY — DX: Headache, unspecified: R51.9

## 2015-09-22 HISTORY — PX: LAPAROSCOPIC APPENDECTOMY: SHX408

## 2015-09-22 HISTORY — DX: Headache: R51

## 2015-09-22 HISTORY — DX: Other specified disorders of nose and nasal sinuses: J34.89

## 2015-09-22 LAB — COMPREHENSIVE METABOLIC PANEL
ALBUMIN: 4.9 g/dL (ref 3.5–5.0)
ALT: 23 U/L (ref 17–63)
ANION GAP: 13 (ref 5–15)
AST: 26 U/L (ref 15–41)
Alkaline Phosphatase: 84 U/L (ref 38–126)
BILIRUBIN TOTAL: 1 mg/dL (ref 0.3–1.2)
BUN: 16 mg/dL (ref 6–20)
CHLORIDE: 99 mmol/L — AB (ref 101–111)
CO2: 28 mmol/L (ref 22–32)
Calcium: 10.1 mg/dL (ref 8.9–10.3)
Creatinine, Ser: 1.06 mg/dL (ref 0.61–1.24)
GFR calc Af Amer: >60 mL/min (ref >60)
GFR calc non Af Amer: >60 mL/min (ref >60)
GLUCOSE: 109 mg/dL — AB (ref 65–99)
POTASSIUM: 4.6 mmol/L (ref 3.5–5.1)
Sodium: 140 mmol/L (ref 135–145)
TOTAL PROTEIN: 8.8 g/dL — AB (ref 6.5–8.1)

## 2015-09-22 LAB — DIFFERENTIAL
BASOS PCT: 0 %
Basophils Absolute: 0 10*3/uL (ref 0.0–0.1)
EOS ABS: 0 10*3/uL (ref 0.0–0.7)
EOS PCT: 0 %
Lymphocytes Relative: 4 %
Lymphs Abs: 0.9 10*3/uL (ref 0.7–4.0)
Monocytes Absolute: 1.2 10*3/uL — ABNORMAL HIGH (ref 0.1–1.0)
Monocytes Relative: 6 %
NEUTROS PCT: 90 %
Neutro Abs: 19.7 10*3/uL — ABNORMAL HIGH (ref 1.7–7.7)

## 2015-09-22 LAB — URINALYSIS, ROUTINE W REFLEX MICROSCOPIC
Bilirubin Urine: NEGATIVE
Glucose, UA: NEGATIVE mg/dL
Ketones, ur: 40 mg/dL — AB
NITRITE: NEGATIVE
PH: 8 (ref 5.0–8.0)
Protein, ur: NEGATIVE mg/dL
SPECIFIC GRAVITY, URINE: 1.022 (ref 1.005–1.030)

## 2015-09-22 LAB — URINE MICROSCOPIC-ADD ON

## 2015-09-22 LAB — CBC
HEMATOCRIT: 47.1 % (ref 39.0–52.0)
HEMOGLOBIN: 15.2 g/dL (ref 13.0–17.0)
MCH: 31.1 pg (ref 26.0–34.0)
MCHC: 32.3 g/dL (ref 30.0–36.0)
MCV: 96.3 fL (ref 78.0–100.0)
Platelets: 203 10*3/uL (ref 150–400)
RBC: 4.89 MIL/uL (ref 4.22–5.81)
RDW: 13.7 % (ref 11.5–15.5)
WBC: 22.3 10*3/uL — AB (ref 4.0–10.5)

## 2015-09-22 LAB — LIPASE, BLOOD: LIPASE: 27 U/L (ref 11–51)

## 2015-09-22 SURGERY — APPENDECTOMY, LAPAROSCOPIC
Anesthesia: General | Site: Abdomen

## 2015-09-22 MED ORDER — ONDANSETRON HCL 4 MG/2ML IJ SOLN
INTRAMUSCULAR | Status: AC
  Filled 2015-09-22: qty 2

## 2015-09-22 MED ORDER — LIDOCAINE HCL (CARDIAC) 20 MG/ML IV SOLN
INTRAVENOUS | Status: DC | PRN
  Administered 2015-09-22: 100 mg via INTRAVENOUS

## 2015-09-22 MED ORDER — ROCURONIUM BROMIDE 100 MG/10ML IV SOLN
INTRAVENOUS | Status: DC | PRN
  Administered 2015-09-22: 35 mg via INTRAVENOUS
  Administered 2015-09-23 (×3): 5 mg via INTRAVENOUS

## 2015-09-22 MED ORDER — GLYCOPYRROLATE 0.2 MG/ML IJ SOLN
INTRAMUSCULAR | Status: DC | PRN
  Administered 2015-09-22: 0.2 mg via INTRAVENOUS
  Administered 2015-09-23: 0.6 mg via INTRAVENOUS

## 2015-09-22 MED ORDER — METRONIDAZOLE IN NACL 5-0.79 MG/ML-% IV SOLN
500.0000 mg | Freq: Once | INTRAVENOUS | Status: AC
  Administered 2015-09-22: 500 mg via INTRAVENOUS
  Filled 2015-09-22: qty 100

## 2015-09-22 MED ORDER — LACTATED RINGERS IV SOLN
INTRAVENOUS | Status: DC | PRN
  Administered 2015-09-22 – 2015-09-23 (×2): via INTRAVENOUS

## 2015-09-22 MED ORDER — LIDOCAINE HCL (CARDIAC) 20 MG/ML IV SOLN
INTRAVENOUS | Status: AC
  Filled 2015-09-22: qty 5

## 2015-09-22 MED ORDER — DEXAMETHASONE SODIUM PHOSPHATE 10 MG/ML IJ SOLN
INTRAMUSCULAR | Status: DC | PRN
  Administered 2015-09-22: 10 mg via INTRAVENOUS

## 2015-09-22 MED ORDER — SUCCINYLCHOLINE CHLORIDE 20 MG/ML IJ SOLN
INTRAMUSCULAR | Status: DC | PRN
  Administered 2015-09-22: 100 mg via INTRAVENOUS

## 2015-09-22 MED ORDER — BUPIVACAINE HCL (PF) 0.5 % IJ SOLN
INTRAMUSCULAR | Status: AC
  Filled 2015-09-22: qty 30

## 2015-09-22 MED ORDER — FENTANYL CITRATE (PF) 100 MCG/2ML IJ SOLN
INTRAMUSCULAR | Status: DC | PRN
  Administered 2015-09-22: 100 ug via INTRAVENOUS
  Administered 2015-09-22 – 2015-09-23 (×3): 50 ug via INTRAVENOUS

## 2015-09-22 MED ORDER — GLYCOPYRROLATE 0.2 MG/ML IJ SOLN
INTRAMUSCULAR | Status: AC
  Filled 2015-09-22: qty 3

## 2015-09-22 MED ORDER — PROPOFOL 10 MG/ML IV BOLUS
INTRAVENOUS | Status: AC
  Filled 2015-09-22: qty 20

## 2015-09-22 MED ORDER — FENTANYL CITRATE (PF) 250 MCG/5ML IJ SOLN
INTRAMUSCULAR | Status: AC
  Filled 2015-09-22: qty 5

## 2015-09-22 MED ORDER — IOHEXOL 300 MG/ML  SOLN
100.0000 mL | Freq: Once | INTRAMUSCULAR | Status: AC | PRN
Start: 2015-09-22 — End: 2015-09-22
  Administered 2015-09-22: 100 mL via INTRAVENOUS

## 2015-09-22 MED ORDER — PROPOFOL 10 MG/ML IV BOLUS
INTRAVENOUS | Status: DC | PRN
  Administered 2015-09-22: 150 mg via INTRAVENOUS

## 2015-09-22 MED ORDER — CEFTRIAXONE SODIUM 1 G IJ SOLR
1.0000 g | Freq: Once | INTRAMUSCULAR | Status: AC
  Administered 2015-09-22: 1 g via INTRAVENOUS
  Filled 2015-09-22: qty 10

## 2015-09-22 MED ORDER — ROCURONIUM BROMIDE 100 MG/10ML IV SOLN
INTRAVENOUS | Status: AC
  Filled 2015-09-22: qty 1

## 2015-09-22 MED ORDER — DEXAMETHASONE SODIUM PHOSPHATE 10 MG/ML IJ SOLN
INTRAMUSCULAR | Status: AC
  Filled 2015-09-22: qty 1

## 2015-09-22 SURGICAL SUPPLY — 43 items
APPLIER CLIP 5 13 M/L LIGAMAX5 (MISCELLANEOUS)
APPLIER CLIP ROT 10 11.4 M/L (STAPLE)
BENZOIN TINCTURE PRP APPL 2/3 (GAUZE/BANDAGES/DRESSINGS) ×3 IMPLANT
CHLORAPREP W/TINT 26ML (MISCELLANEOUS) ×3 IMPLANT
CLIP APPLIE 5 13 M/L LIGAMAX5 (MISCELLANEOUS) IMPLANT
CLIP APPLIE ROT 10 11.4 M/L (STAPLE) IMPLANT
CLOSURE WOUND 1/2 X4 (GAUZE/BANDAGES/DRESSINGS) ×1
COVER SURGICAL LIGHT HANDLE (MISCELLANEOUS) ×3 IMPLANT
CUTTER FLEX LINEAR 45M (STAPLE) ×3 IMPLANT
DECANTER SPIKE VIAL GLASS SM (MISCELLANEOUS) IMPLANT
DRAIN CHANNEL 19F RND (DRAIN) IMPLANT
DRAPE LAPAROSCOPIC ABDOMINAL (DRAPES) ×3 IMPLANT
DRSG TEGADERM 2-3/8X2-3/4 SM (GAUZE/BANDAGES/DRESSINGS) ×12 IMPLANT
ELECT REM PT RETURN 9FT ADLT (ELECTROSURGICAL) ×3
ELECTRODE REM PT RTRN 9FT ADLT (ELECTROSURGICAL) ×1 IMPLANT
ENDOLOOP SUT PDS II  0 18 (SUTURE) ×2
ENDOLOOP SUT PDS II 0 18 (SUTURE) ×1 IMPLANT
EVACUATOR SILICONE 100CC (DRAIN) IMPLANT
GAUZE SPONGE 2X2 8PLY STRL LF (GAUZE/BANDAGES/DRESSINGS) ×1 IMPLANT
GLOVE ECLIPSE 8.0 STRL XLNG CF (GLOVE) ×3 IMPLANT
GLOVE INDICATOR 8.0 STRL GRN (GLOVE) ×3 IMPLANT
GOWN STRL REUS W/TWL XL LVL3 (GOWN DISPOSABLE) ×6 IMPLANT
KIT BASIN OR (CUSTOM PROCEDURE TRAY) ×3 IMPLANT
POUCH SPECIMEN RETRIEVAL 10MM (ENDOMECHANICALS) ×6 IMPLANT
RELOAD 45 VASCULAR/THIN (ENDOMECHANICALS) IMPLANT
RELOAD STAPLE TA45 3.5 REG BLU (ENDOMECHANICALS) ×3 IMPLANT
SCISSORS LAP 5X35 DISP (ENDOMECHANICALS) IMPLANT
SET IRRIG TUBING LAPAROSCOPIC (IRRIGATION / IRRIGATOR) ×3 IMPLANT
SHEARS HARMONIC ACE PLUS 36CM (ENDOMECHANICALS) ×3 IMPLANT
SLEEVE XCEL OPT CAN 5 100 (ENDOMECHANICALS) ×6 IMPLANT
SOLUTION ANTI FOG 6CC (MISCELLANEOUS) ×3 IMPLANT
SPONGE GAUZE 2X2 STER 10/PKG (GAUZE/BANDAGES/DRESSINGS) ×2
STRIP CLOSURE SKIN 1/2X4 (GAUZE/BANDAGES/DRESSINGS) ×2 IMPLANT
SUT ETHILON 3 0 PS 1 (SUTURE) IMPLANT
SUT MNCRL AB 4-0 PS2 18 (SUTURE) ×3 IMPLANT
TOWEL OR 17X26 10 PK STRL BLUE (TOWEL DISPOSABLE) ×3 IMPLANT
TOWEL OR NON WOVEN STRL DISP B (DISPOSABLE) ×3 IMPLANT
TRAY FOLEY W/METER SILVER 14FR (SET/KITS/TRAYS/PACK) IMPLANT
TRAY FOLEY W/METER SILVER 16FR (SET/KITS/TRAYS/PACK) ×3 IMPLANT
TRAY LAPAROSCOPIC (CUSTOM PROCEDURE TRAY) ×3 IMPLANT
TROCAR BLADELESS OPT 5 100 (ENDOMECHANICALS) ×3 IMPLANT
TROCAR XCEL BLUNT TIP 100MML (ENDOMECHANICALS) ×3 IMPLANT
TUBING INSUFFLATION 10FT LAP (TUBING) ×3 IMPLANT

## 2015-09-22 NOTE — ED Notes (Signed)
Pt at CT

## 2015-09-22 NOTE — ED Notes (Signed)
Patient transported to X-ray 

## 2015-09-22 NOTE — ED Provider Notes (Signed)
CSN: [id number]     Arrival date & time 09/22/15  1357 History   First MD Initiated Contact with Patient 09/22/15 1718     Chief Complaint  Patient presents with  . Abdominal Pain     (Consider location/radiation/quality/duration/timing/severity/associated sxs/prior Treatment) Patient is a 55 y.o. male presenting with abdominal pain. The history is provided by the patient and medical records. No language interpreter was used.  Abdominal Pain Associated symptoms: no chills, no cough, no dysuria, no fever, no nausea, no shortness of breath, no sore throat and no vomiting    Phillip Deleon is a 55 y.o. male  with no known PMH who presents to the Emergency Department complaining of acute onset of crampy, central abdominal pain since 7am this morning. Pt. Took pepto and tums PTA with no relief. Denies n/v/d. Denies fever. Had two non-bloody, formed BM's today. No prior abdominal PMH or surgeries. Pt. States he had fish last night and assumed abdominal pain was from food poisoning, however when it did not improve with OTC meds, he decided to come to ED.   Past Medical History  Diagnosis Date  . Headache   . Sinus drainage    Past Surgical History  Procedure Laterality Date  . Foot surgery  surgery x 2    With hardware placed   Family History  Problem Relation Age of Onset  . Cancer - Colon Mother    Social History  Substance Use Topics  . Smoking status: Former Smoker -- 15 years    Types: Cigarettes    Quit date: 09/21/1988  . Smokeless tobacco: Never Used  . Alcohol Use: No    Review of Systems  Constitutional: Negative for fever and chills.  HENT: Negative for congestion and sore throat.   Eyes: Negative for visual disturbance.  Respiratory: Negative for cough, shortness of breath and wheezing.   Cardiovascular: Negative.   Gastrointestinal: Positive for abdominal pain. Negative for nausea and vomiting.  Genitourinary: Negative for dysuria.  Musculoskeletal: Negative for  back pain and neck pain.  Skin: Negative for rash.  Neurological: Negative for dizziness, weakness and headaches.      Allergies  Pork-derived products  Home Medications   Prior to Admission medications   Medication Sig Start Date End Date Taking? Authorizing Provider  bismuth subsalicylate (PEPTO BISMOL) 262 MG/15ML suspension Take 30 mLs by mouth every 6 (six) hours as needed for indigestion or diarrhea or loose stools.   Yes Historical Provider, MD  calcium carbonate (TUMS EX) 750 MG chewable tablet Chew 1 tablet by mouth 2 (two) times daily as needed for heartburn.   Yes Historical Provider, MD  Phenyleph-Doxylamine-DM-APAP (ALKA-SELTZER PLS NIGHT CLD/FLU) 5-6.25-10-325 MG CAPS Take 1 tablet by mouth 2 (two) times daily as needed (cough cold).   Yes Historical Provider, MD   BP 132/73 mmHg  Pulse 66  Temp(Src) 98.5 F (36.9 C) (Oral)  Resp 23  SpO2 96% Physical Exam  Constitutional: He is oriented to person, place, and time. He appears well-developed and well-nourished.  Alert and in no acute distress  HENT:  Head: Normocephalic and atraumatic.  Cardiovascular: Normal rate, regular rhythm, normal heart sounds and intact distal pulses.  Exam reveals no gallop and no friction rub.   No murmur heard. Pulmonary/Chest: Effort normal and breath sounds normal. No respiratory distress. He has no wheezes. He has no rales. He exhibits no tenderness.  Abdominal: Soft. Bowel sounds are normal. He exhibits no distension and no mass. There is tenderness. There is  no rebound and no guarding.  Musculoskeletal: He exhibits no edema.  Neurological: He is alert and oriented to person, place, and time.  Skin: Skin is warm and dry. No rash noted.  Psychiatric: He has a normal mood and affect. His behavior is normal. Judgment and thought content normal.  Nursing note and vitals reviewed.   ED Course  Procedures (including critical care time) Labs Review Labs Reviewed  COMPREHENSIVE  METABOLIC PANEL - Abnormal; Notable for the following:    Chloride 99 (*)    Glucose, Bld 109 (*)    Total Protein 8.8 (*)    All other components within normal limits  CBC - Abnormal; Notable for the following:    WBC 22.3 (*)    All other components within normal limits  URINALYSIS, ROUTINE W REFLEX MICROSCOPIC (NOT AT [facility]) - Abnormal; Notable for the following:    Hgb urine dipstick SMALL (*)    Ketones, ur 40 (*)    Leukocytes, UA TRACE (*)    All other components within normal limits  URINE MICROSCOPIC-ADD ON - Abnormal; Notable for the following:    Squamous Epithelial / LPF 0-5 (*)    Bacteria, UA RARE (*)    All other components within normal limits  DIFFERENTIAL - Abnormal; Notable for the following:    Neutro Abs 19.7 (*)    Monocytes Absolute 1.2 (*)    All other components within normal limits  LIPASE, BLOOD  CBC WITH DIFFERENTIAL/PLATELET    Imaging Review Dg Chest 2 View  09/22/2015  CLINICAL DATA:  Preop acute appendicitis.  Former smoker. EXAM: CHEST  2 VIEW COMPARISON:  None. FINDINGS: The heart size and mediastinal contours are within normal limits. Both lungs are clear. The visualized skeletal structures show severe dextro convex scoliosis. IMPRESSION: No active cardiopulmonary disease. Electronically Signed   By: Elsie Stain M.D.   On: 09/22/2015 21:24   Ct Abdomen Pelvis W Contrast  09/22/2015  CLINICAL DATA:  55 year old male with abdominal pain and leukocytosis. EXAM: CT ABDOMEN AND PELVIS WITH CONTRAST TECHNIQUE: Multidetector CT imaging of the abdomen and pelvis was performed using the standard protocol following bolus administration of intravenous contrast. CONTRAST:  OMNIPAQUE IOHEXOL 300 MG/ML  SOLN COMPARISON:  None. FINDINGS: The visualized lung bases are clear. No intra-abdominal free air or free fluid. Subcentimeter right hepatic hypodense lesion is too small to characterize but may represent a cyst or hemangioma. The liver is otherwise  unremarkable. The gallbladder, pancreas, spleen, adrenal glands appear unremarkable. There is a punctate nonobstructing right renal inferior pole calculus. No hydronephrosis. Multiple subcentimeter right renal hypodense lesions are not well characterized but may represent cysts. The left kidney is unremarkable. The visualized ureters are grossly unremarkable. There is apparent diffuse thickening of the bladder wall which may be partly related to underdistention. Cystitis is not excluded. Correlation with urinalysis recommended. The prostate gland is enlarged measuring up to 6.2 cm in transverse axial dimension. There are scattered colonic diverticula without active inflammatory changes. There is no evidence of bowel obstruction. The appendix is enlarged and inflamed. It is located in the right upper abdomen in the posterior right subhepatic space. There is no evidence of perforation or abscess. The abdominal aorta is tortuous. No portal venous gas identified. There is no adenopathy. Small fat containing umbilical hernia. The abdominal wall soft tissues appear unremarkable. There is thoracolumbar scoliosis. No acute fracture. IMPRESSION: Acute appendicitis.  No abscess. Electronically Signed   By: Elgie Collard M.D.   On:  09/22/2015 20:31   I have personally reviewed and evaluated these images and lab results as part of my medical decision-making.   EKG Interpretation None      MDM   Final diagnoses:  Acute appendicitis   Phillip Deleon presents with acute onset of central abdominal pain this morning. No n/v/d. No fever.   Labs: CBC with white count of 22.3; CMP; Lipase; UA  Imaging: CT abdomen shows acute appendicitis  Consults: Surgery, Dr. Abbey Chatters, who recommends CXR, EKG, 1 g Rocephin, Flagyl and he will see patient for further evaluation and management.   [facility] Phillip Markin, PA-C 09/22/15 2332  Arby Barrette, MD 09/23/15 [phone]

## 2015-09-22 NOTE — Anesthesia Procedure Notes (Signed)
Procedure Name: Intubation Date/Time: 09/22/2015 11:39 PM Performed by: Orest Dikes Pre-anesthesia Checklist: Patient identified, Emergency Drugs available, Suction available and Patient being monitored Patient Re-evaluated:Patient Re-evaluated prior to inductionOxygen Delivery Method: Circle System Utilized Preoxygenation: Pre-oxygenation with 100% oxygen Intubation Type: IV induction, Rapid sequence and Cricoid Pressure applied Laryngoscope Size: Mac and 4 Grade View: Grade II Tube type: Oral Tube size: 7.5 mm Airway Equipment and Method: Stylet Placement Confirmation: ETT inserted through vocal cords under direct vision,  positive ETCO2 and breath sounds checked- equal and bilateral Secured at: 21 cm Tube secured with: Tape Dental Injury: Teeth and Oropharynx as per pre-operative assessment

## 2015-09-22 NOTE — Progress Notes (Signed)
[facility] consulted for medication assistance.  EDCM spoke to patient at bedside.  Patient reports he doesn't take medications.  Reports just taking alka seltzer or tums for his head cold.  Patient report s he has only been here in [place] for three weeks.  He reports receiving disability/SS.  Reports staying at the [facility] in [place].      [facility] provided patient with contact infromation to [facility], informed patient of services there and walk in times.  EDCM also provided patient with list of pcps who accept self pay patients, list of discount pharmacies and websites needymeds.org and GoodRX.com for medication assistance, phone number to inquire about the orange card, phone number to inquire about Mediciad, phone number to inquire about the Affordable Care Act, financial resources in the community such as local churches, salvation army, urban ministries, and dental assistance for uninsured patients.  Patient thankful for resources.  No further EDCM needs at this time.

## 2015-09-22 NOTE — ED Notes (Signed)
Per EMS, central abd pain, tender to palpation since 0700 this am. Denies N/V/D, having normal BM. Believes it to be food poisoning from fish eaten last night.

## 2015-09-22 NOTE — Anesthesia Preprocedure Evaluation (Signed)
Anesthesia Evaluation  Patient identified by MRN, date of birth, ID band Patient awake    Reviewed: Allergy & Precautions, NPO status , [name] Chart, lab work & pertinent test results  Airway Mallampati: II  TM Distance: >3 FB Neck ROM: Full    Dental  (+) Teeth Intact, Dental Advisory Given   Pulmonary former smoker,    breath sounds clear to auscultation       Cardiovascular  Rhythm:Regular Rate:Normal     Neuro/Psych    GI/Hepatic   Endo/Other    Renal/GU      Musculoskeletal   Abdominal   Peds  Hematology   Anesthesia Other Findings   Reproductive/Obstetrics                             Anesthesia Physical Anesthesia Plan  ASA: II and emergent  Anesthesia Plan: General   Post-op Pain Management:    Induction: Intravenous, Cricoid pressure planned and Rapid sequence  Airway Management Planned: Oral ETT  Additional Equipment:   Intra-op Plan:   Post-operative Plan: Extubation in OR  Informed Consent: I have reviewed the patients History and Physical, chart, labs and discussed the procedure including the risks, benefits and alternatives for the proposed anesthesia with the patient or authorized representative who has indicated his/her understanding and acceptance.   Dental advisory given  Plan Discussed with: CRNA and Anesthesiologist  Anesthesia Plan Comments: (Acute appendicitis Scoliosis  Plan GA with RSI  Kipp Brood, MD)        Anesthesia Quick Evaluation

## 2015-09-22 NOTE — ED Notes (Signed)
Surgeon at bedside.  

## 2015-09-22 NOTE — H&P (Signed)
Phillip Deleon is an 55 y.o. male.   Chief Complaint:  Abdominal pain HPI:   He reports the onset of crampy epigastric abdominal pain that began at 7:00 this morning. He tried Tums, Alka-Seltzer, and Pepto-Bismol but did not get relief of his symptoms.  His pain persisted and by the time he presented to the emergency department it'd radiated to the right flank. He was noted to have a leukocytosis. CT scan was performed. This demonstrated a dilated inflamed appendix lying in the right upper quadrant subhepatic space consistent with acute appendicitis. For this reason I was asked to see him. He is just getting over an upper respiratory infection.  Of note is that he has moved here recently from [place] and currently is living in the [facility].  Past Medical History  Diagnosis Date  . Headache   . Sinus drainage     LE DVT  Past Surgical History  Procedure Laterality Date  . Foot surgery  surgery x 2    With hardware placed    Family History  Problem Relation Age of Onset  . Cancer - Colon Mother    Social History:  reports that he quit smoking about 27 years ago. His smoking use included Cigarettes. He quit after 15 years of use. He has never used smokeless tobacco. He reports that he does not drink alcohol or use illicit drugs.  Allergies:  Allergies  Allergen Reactions  . Pork-Derived Products     Blood pressure goes up really high, gets slurred speak   Prior to Admission medications   Medication Sig Start Date End Date Taking? Authorizing Provider  bismuth subsalicylate (PEPTO BISMOL) 262 MG/15ML suspension Take 30 mLs by mouth every 6 (six) hours as needed for indigestion or diarrhea or loose stools.   Yes Historical Provider, MD  calcium carbonate (TUMS EX) 750 MG chewable tablet Chew 1 tablet by mouth 2 (two) times daily as needed for heartburn.   Yes Historical Provider, MD  Phenyleph-Doxylamine-DM-APAP (ALKA-SELTZER PLS NIGHT CLD/FLU) 5-6.25-10-325 MG CAPS Take 1 tablet by  mouth 2 (two) times daily as needed (cough cold).   Yes Historical Provider, MD      Results for orders placed or performed during the hospital encounter of 09/22/15 (from the past 48 hour(s))  Lipase, blood     Status: None   Collection Time: 09/22/15  2:26 PM  Result Value Ref Range   Lipase 27 11 - 51 U/L  Comprehensive metabolic panel     Status: Abnormal   Collection Time: 09/22/15  2:26 PM  Result Value Ref Range   Sodium 140 135 - 145 mmol/L   Potassium 4.6 3.5 - 5.1 mmol/L   Chloride 99 (L) 101 - 111 mmol/L   CO2 28 22 - 32 mmol/L   Glucose, Bld 109 (H) 65 - 99 mg/dL   BUN 16 6 - 20 mg/dL   Creatinine, Ser 1.06 0.61 - 1.24 mg/dL   Calcium 10.1 8.9 - 10.3 mg/dL   Total Protein 8.8 (H) 6.5 - 8.1 g/dL   Albumin 4.9 3.5 - 5.0 g/dL   AST 26 15 - 41 U/L   ALT 23 17 - 63 U/L   Alkaline Phosphatase 84 38 - 126 U/L   Total Bilirubin 1.0 0.3 - 1.2 mg/dL   GFR calc non Af Amer >60 >60 mL/min   GFR calc Af Amer >60 >60 mL/min    Comment: (NOTE) The eGFR has been calculated using the CKD EPI equation. This calculation has  not been validated in all clinical situations. eGFR's persistently <60 mL/min signify possible Chronic Kidney Disease.    Anion gap 13 5 - 15  CBC     Status: Abnormal   Collection Time: 09/22/15  2:26 PM  Result Value Ref Range   WBC 22.3 (H) 4.0 - 10.5 K/uL   RBC 4.89 4.22 - 5.81 MIL/uL   Hemoglobin 15.2 13.0 - 17.0 g/dL   HCT 47.1 39.0 - 52.0 %   MCV 96.3 78.0 - 100.0 fL   MCH 31.1 26.0 - 34.0 pg   MCHC 32.3 30.0 - 36.0 g/dL   RDW 13.7 11.5 - 15.5 %   Platelets 203 150 - 400 K/uL  Differential     Status: Abnormal   Collection Time: 09/22/15  2:26 PM  Result Value Ref Range   Neutrophils Relative % 90 %   Neutro Abs 19.7 (H) 1.7 - 7.7 K/uL   Lymphocytes Relative 4 %   Lymphs Abs 0.9 0.7 - 4.0 K/uL   Monocytes Relative 6 %   Monocytes Absolute 1.2 (H) 0.1 - 1.0 K/uL   Eosinophils Relative 0 %   Eosinophils Absolute 0.0 0.0 - 0.7 K/uL    Basophils Relative 0 %   Basophils Absolute 0.0 0.0 - 0.1 K/uL  Urinalysis, Routine w reflex microscopic (not at [facility])     Status: Abnormal   Collection Time: 09/22/15  5:56 PM  Result Value Ref Range   Color, Urine YELLOW YELLOW   APPearance CLEAR CLEAR   Specific Gravity, Urine 1.022 1.005 - 1.030   pH 8.0 5.0 - 8.0   Glucose, UA NEGATIVE NEGATIVE mg/dL   Hgb urine dipstick SMALL (A) NEGATIVE   Bilirubin Urine NEGATIVE NEGATIVE   Ketones, ur 40 (A) NEGATIVE mg/dL   Protein, ur NEGATIVE NEGATIVE mg/dL   Nitrite NEGATIVE NEGATIVE   Leukocytes, UA TRACE (A) NEGATIVE  Urine microscopic-add on     Status: Abnormal   Collection Time: 09/22/15  5:56 PM  Result Value Ref Range   Squamous Epithelial / LPF 0-5 (A) NONE SEEN   WBC, UA 0-5 0 - 5 WBC/hpf   RBC / HPF 6-30 0 - 5 RBC/hpf   Bacteria, UA RARE (A) NONE SEEN   Urine-Other MUCOUS PRESENT    Dg Chest 2 View  09/22/2015  CLINICAL DATA:  Preop acute appendicitis.  Former smoker. EXAM: CHEST  2 VIEW COMPARISON:  None. FINDINGS: The heart size and mediastinal contours are within normal limits. Both lungs are clear. The visualized skeletal structures show severe dextro convex scoliosis. IMPRESSION: No active cardiopulmonary disease. Electronically Signed   By: Staci Righter M.D.   On: 09/22/2015 21:24   Ct Abdomen Pelvis W Contrast  09/22/2015  CLINICAL DATA:  55 year old male with abdominal pain and leukocytosis. EXAM: CT ABDOMEN AND PELVIS WITH CONTRAST TECHNIQUE: Multidetector CT imaging of the abdomen and pelvis was performed using the standard protocol following bolus administration of intravenous contrast. CONTRAST:  18m OMNIPAQUE IOHEXOL 300 MG/ML  SOLN COMPARISON:  None. FINDINGS: The visualized lung bases are clear. No intra-abdominal free air or free fluid. Subcentimeter right hepatic hypodense lesion is too small to characterize but may represent a cyst or hemangioma. The liver is otherwise unremarkable. The gallbladder, pancreas,  spleen, adrenal glands appear unremarkable. There is a punctate nonobstructing right renal inferior pole calculus. No hydronephrosis. Multiple subcentimeter right renal hypodense lesions are not well characterized but may represent cysts. The left kidney is unremarkable. The visualized ureters are grossly unremarkable. There  is apparent diffuse thickening of the bladder wall which may be partly related to underdistention. Cystitis is not excluded. Correlation with urinalysis recommended. The prostate gland is enlarged measuring up to 6.2 cm in transverse axial dimension. There are scattered colonic diverticula without active inflammatory changes. There is no evidence of bowel obstruction. The appendix is enlarged and inflamed. It is located in the right upper abdomen in the posterior right subhepatic space. There is no evidence of perforation or abscess. The abdominal aorta is tortuous. No portal venous gas identified. There is no adenopathy. Small fat containing umbilical hernia. The abdominal wall soft tissues appear unremarkable. There is thoracolumbar scoliosis. No acute fracture. IMPRESSION: Acute appendicitis.  No abscess. Electronically Signed   By: Anner Crete M.D.   On: 09/22/2015 20:31    Review of Systems  Constitutional: Positive for fever and chills.  HENT: Positive for congestion (mild).   Respiratory: Negative for shortness of breath.   Cardiovascular: Negative for chest pain.  Gastrointestinal: Positive for abdominal pain. Negative for vomiting, diarrhea and constipation.  Genitourinary: Negative for dysuria and hematuria.  Neurological: Negative for focal weakness.    Blood pressure 132/75, pulse 71, temperature 98.5 F (36.9 C), temperature source Oral, resp. rate 18, SpO2 98 %. Physical Exam  Constitutional: He appears well-developed and well-nourished. No distress.  HENT:  Head: Normocephalic and atraumatic.  Eyes: No scleral icterus.  Cardiovascular: Normal rate and  regular rhythm.   Respiratory: Effort normal and breath sounds normal.  GI: Soft. He exhibits no mass. There is tenderness (Mild in right upper quadrant, laterally).  Reducible umbilical hernia.  Musculoskeletal: He exhibits no edema.  Neurological: He is alert.  Skin: Skin is warm and dry.  Psychiatric: He has a normal mood and affect. His behavior is normal.     Assessment/Plan 1. Acute appendicitis with appendicitis with appendix in the right upper quadrant area.  2. Umbilical hernia containing fat that is easily reducible.  Plan: IV antibiotics.  Laparoscopic possible open appendectomy. I told him elective hernia repair with mesh could be discussed at a later time. I have discussed the procedure and risks of appendectomy. The risks include but are not limited to bleeding, infection, wound problems, anesthesia, injury to intra-abdominal organs, possibility of postoperative ileus. He seems to understand and agrees with the plan.  Odis Hollingshead, MD 09/22/2015, 9:53 PM

## 2015-09-23 ENCOUNTER — Encounter (HOSPITAL_COMMUNITY): Payer: Self-pay | Admitting: General Surgery

## 2015-09-23 DIAGNOSIS — K429 Umbilical hernia without obstruction or gangrene: Secondary | ICD-10-CM | POA: Diagnosis not present

## 2015-09-23 DIAGNOSIS — Z87891 Personal history of nicotine dependence: Secondary | ICD-10-CM | POA: Diagnosis not present

## 2015-09-23 DIAGNOSIS — K3580 Unspecified acute appendicitis: Secondary | ICD-10-CM | POA: Diagnosis present

## 2015-09-23 DIAGNOSIS — Z86718 Personal history of other venous thrombosis and embolism: Secondary | ICD-10-CM | POA: Diagnosis not present

## 2015-09-23 DIAGNOSIS — K353 Acute appendicitis with localized peritonitis: Secondary | ICD-10-CM | POA: Diagnosis not present

## 2015-09-23 MED ORDER — ONDANSETRON HCL 4 MG/2ML IJ SOLN: 4.0000 mg | INTRAMUSCULAR | Status: DC | PRN

## 2015-09-23 MED ORDER — ESMOLOL HCL 100 MG/10ML IV SOLN
INTRAVENOUS | Status: DC | PRN
  Administered 2015-09-23 (×2): 20 mg via INTRAVENOUS

## 2015-09-23 MED ORDER — ONDANSETRON HCL 4 MG/2ML IJ SOLN
4.0000 mg | Freq: Once | INTRAMUSCULAR | Status: DC | PRN
Start: 2015-09-23 — End: 2015-09-23

## 2015-09-23 MED ORDER — NEOSTIGMINE METHYLSULFATE 10 MG/10ML IV SOLN
INTRAVENOUS | Status: DC | PRN
  Administered 2015-09-23: 5 mg via INTRAVENOUS

## 2015-09-23 MED ORDER — ONDANSETRON HCL 4 MG/2ML IJ SOLN
INTRAMUSCULAR | Status: DC | PRN
  Administered 2015-09-23: 4 mg via INTRAVENOUS

## 2015-09-23 MED ORDER — METRONIDAZOLE IN NACL 5-0.79 MG/ML-% IV SOLN
500.0000 mg | Freq: Three times a day (TID) | INTRAVENOUS | Status: DC
  Administered 2015-09-23 – 2015-09-24 (×4): 500 mg via INTRAVENOUS
  Filled 2015-09-23 (×5): qty 100

## 2015-09-23 MED ORDER — OXYCODONE HCL 5 MG/5ML PO SOLN
5.0000 mg | Freq: Once | ORAL | Status: DC | PRN
  Filled 2015-09-23: qty 5

## 2015-09-23 MED ORDER — HYDRALAZINE HCL 20 MG/ML IJ SOLN: 20.0000 mg | INTRAMUSCULAR | Status: DC | PRN

## 2015-09-23 MED ORDER — PHENYLEPHRINE 40 MCG/ML (10ML) SYRINGE FOR IV PUSH (FOR BLOOD PRESSURE SUPPORT)
PREFILLED_SYRINGE | INTRAVENOUS | Status: AC
  Filled 2015-09-23: qty 10

## 2015-09-23 MED ORDER — 0.9 % SODIUM CHLORIDE (POUR BTL) OPTIME
TOPICAL | Status: DC | PRN
  Administered 2015-09-23: 1000 mL

## 2015-09-23 MED ORDER — BUPIVACAINE HCL (PF) 0.5 % IJ SOLN
INTRAMUSCULAR | Status: DC | PRN
  Administered 2015-09-23: 5 mL

## 2015-09-23 MED ORDER — ESMOLOL HCL 100 MG/10ML IV SOLN
INTRAVENOUS | Status: AC
  Filled 2015-09-23: qty 10

## 2015-09-23 MED ORDER — KCL IN DEXTROSE-NACL 20-5-0.9 MEQ/L-%-% IV SOLN
INTRAVENOUS | Status: DC
  Administered 2015-09-23 (×2): via INTRAVENOUS
  Filled 2015-09-23 (×4): qty 1000

## 2015-09-23 MED ORDER — LACTATED RINGERS IV SOLN
INTRAVENOUS | Status: DC | PRN
  Administered 2015-09-23: 2000 mL

## 2015-09-23 MED ORDER — OXYCODONE HCL 5 MG PO TABS: 5.0000 mg | ORAL_TABLET | Freq: Once | ORAL | Status: DC | PRN

## 2015-09-23 MED ORDER — DEXTROSE 5 % IV SOLN
2.0000 g | INTRAVENOUS | Status: DC
  Administered 2015-09-23 – 2015-09-24 (×2): 2 g via INTRAVENOUS
  Filled 2015-09-23 (×2): qty 2

## 2015-09-23 MED ORDER — LABETALOL HCL 5 MG/ML IV SOLN
5.0000 mg | INTRAVENOUS | Status: DC | PRN
  Administered 2015-09-23: 5 mg via INTRAVENOUS

## 2015-09-23 MED ORDER — HYDROMORPHONE HCL 1 MG/ML IJ SOLN: 0.2500 mg | INTRAMUSCULAR | Status: DC | PRN

## 2015-09-23 MED ORDER — MORPHINE SULFATE (PF) 2 MG/ML IV SOLN: 2.0000 mg | INTRAVENOUS | Status: DC | PRN

## 2015-09-23 MED ORDER — HYDROCODONE-ACETAMINOPHEN 5-325 MG PO TABS: 1.0000 | ORAL_TABLET | ORAL | Status: DC | PRN

## 2015-09-23 MED ORDER — ACETAMINOPHEN 325 MG PO TABS: 650.0000 mg | ORAL_TABLET | Freq: Four times a day (QID) | ORAL | Status: DC | PRN

## 2015-09-23 MED ORDER — LACTATED RINGERS IV SOLN: INTRAVENOUS | Status: DC

## 2015-09-23 MED ORDER — LABETALOL HCL 5 MG/ML IV SOLN
INTRAVENOUS | Status: AC
  Administered 2015-09-23: 5 mg via INTRAVENOUS
  Filled 2015-09-23: qty 4

## 2015-09-23 MED ORDER — ONDANSETRON 4 MG PO TBDP: 4.0000 mg | ORAL_TABLET | Freq: Four times a day (QID) | ORAL | Status: DC | PRN

## 2015-09-23 MED ORDER — IBUPROFEN 200 MG PO TABS: 600.0000 mg | ORAL_TABLET | Freq: Four times a day (QID) | ORAL | Status: DC | PRN

## 2015-09-23 NOTE — Progress Notes (Signed)
1 Day Post-Op  Subjective: He is doing fine this AM, sore, but no real complaints.    Objective: Vital signs in last 24 hours: Temp:  [98.2 F (36.8 C)-99.6 F (37.6 C)] 98.7 F (37.1 C) (02/07 0628) Pulse Rate:  [66-95] 69 (02/07 0628) Resp:  [9-30] 18 (02/07 0628) BP: (105-173)/(62-102) 105/62 mmHg (02/07 0628) SpO2:  [90 %-100 %] 97 % (02/07 0628) FiO2 (%):  [2 %] 2 % (02/07 0628) Weight:  [107.049 kg (236 lb)] 107.049 kg (236 lb) (02/07 0230) Last BM Date: 09/22/15 75 urine is all that is recorded Afebrile, VSS No labs/films Diet: clears  Intake/Output from previous day: 02/06 0701 - 02/07 0700 In: 1300 [I.V.:1300] Out: 75 [Urine:75] Intake/Output this shift:    General appearance: alert, cooperative and no distress Resp: clear to auscultation bilaterally GI: soft sore, site dry and intact dressing.  Few hypoactive BS.  Lab Results:   Recent Labs  09/22/15 1426  WBC 22.3*  HGB 15.2  HCT 47.1  PLT 203    BMET  Recent Labs  09/22/15 1426  NA 140  K 4.6  CL 99*  CO2 28  GLUCOSE 109*  BUN 16  CREATININE 1.06  CALCIUM 10.1   PT/INR No results for input(s): LABPROT, INR in the last 72 hours.   Recent Labs Lab 09/22/15 1426  AST 26  ALT 23  ALKPHOS 84  BILITOT 1.0  PROT 8.8*  ALBUMIN 4.9     Lipase     Component Value Date/Time   LIPASE 27 09/22/2015 1426     Studies/Results: Dg Chest 2 View  09/22/2015  CLINICAL DATA:  Preop acute appendicitis.  Former smoker. EXAM: CHEST  2 VIEW COMPARISON:  None. FINDINGS: The heart size and mediastinal contours are within normal limits. Both lungs are clear. The visualized skeletal structures show severe dextro convex scoliosis. IMPRESSION: No active cardiopulmonary disease. Electronically Signed   By: Elsie Stain M.D.   On: 09/22/2015 21:24   Ct Abdomen Pelvis W Contrast  09/22/2015  CLINICAL DATA:  55 year old male with abdominal pain and leukocytosis. EXAM: CT ABDOMEN AND PELVIS WITH CONTRAST  TECHNIQUE: Multidetector CT imaging of the abdomen and pelvis was performed using the standard protocol following bolus administration of intravenous contrast. CONTRAST:  OMNIPAQUE IOHEXOL 300 MG/ML  SOLN COMPARISON:  None. FINDINGS: The visualized lung bases are clear. No intra-abdominal free air or free fluid. Subcentimeter right hepatic hypodense lesion is too small to characterize but may represent a cyst or hemangioma. The liver is otherwise unremarkable. The gallbladder, pancreas, spleen, adrenal glands appear unremarkable. There is a punctate nonobstructing right renal inferior pole calculus. No hydronephrosis. Multiple subcentimeter right renal hypodense lesions are not well characterized but may represent cysts. The left kidney is unremarkable. The visualized ureters are grossly unremarkable. There is apparent diffuse thickening of the bladder wall which may be partly related to underdistention. Cystitis is not excluded. Correlation with urinalysis recommended. The prostate gland is enlarged measuring up to 6.2 cm in transverse axial dimension. There are scattered colonic diverticula without active inflammatory changes. There is no evidence of bowel obstruction. The appendix is enlarged and inflamed. It is located in the right upper abdomen in the posterior right subhepatic space. There is no evidence of perforation or abscess. The abdominal aorta is tortuous. No portal venous gas identified. There is no adenopathy. Small fat containing umbilical hernia. The abdominal wall soft tissues appear unremarkable. There is thoracolumbar scoliosis. No acute fracture. IMPRESSION: Acute appendicitis.  No  abscess. Electronically Signed   By: Elgie Collard M.D.   On: 09/22/2015 20:31    Medications: . cefTRIAXone (ROCEPHIN)  IV  2 g Intravenous Q24H   And  . metronidazole  500 mg Intravenous 3 times per day    Assessment/Plan Acute appendicitis/umbilical hernia  S/p laparoscopic appendectomy 09/22/15,  Dr. Abbey Chatters Hx of headache Hx of LE DVT Hx of back pain working on disability with SSI Body mass index is 35.8 Antibiotics:  Ceftriaxone/Flagyl day 2 DVT:  SCD/start Lovenox this PM    Plan:  Just finished around 1 AM, appendix had to come out in 2 pieces,so we will continue antibiotics today, mobilize and aim for discharge tomorrow.   Recheck labs in AM, advance diet as tolerated.  LOS: 0 days    Phillip Deleon,Phillip Deleon 09/23/2015  Agree with above. He has walked. Has some incisional pain.  Ovidio Kin, MD, [facility] Surgery Pager: [phone] Office phone:  [phone]

## 2015-09-23 NOTE — Care Management Note (Signed)
Case Management Note  Patient Details  Name: Phillip Deleon MRN: [id number] Date of Birth: Dec 12, 1960  Subjective/Objective:                  Acute appendicitis Action/Plan: Discharge planning Expected Discharge Date:   (unknown)               Expected Discharge Plan:  Home/Self Care  In-House Referral:     Discharge planning Services  CM Consult, Medication Assistance, [facility]  Post Acute Care Choice:    Choice offered to:  Patient  DME Arranged:    DME Agency:     HH Arranged:    [place] Agency:     Status of Service:     Medicare Important Message Given:    Date Medicare IM Given:    Medicare IM give by:    Date Additional Medicare IM Given:    Additional Medicare Important Message give by:     If discussed at [place] of Stay Meetings, dates discussed:    Additional Comments: Cm met with pt who states he has met with financial counseling this morning.  Pt states he lives in a motel.  Pt has no income, no insurance and takes the city bus.  CM gave pt [facility] pamphlet as he states it is difficult to make appts due to his dependence on the city bus and he will go to the walk in clinic any weekday morning from 9-10 this week (on bus route).  Pt states understanding he will ask for AN APPOINTMENT FOR A PCP; AN APPOINTMENT TO MEED WITH A NAVIGATOR TO GET INSURANCE; AN APPOINTMENT FOR FOLLOW UP MEDICAL CARE.  CM  Will continue to follow for discharge prescription costs for possible MATCH. Dellie Catholic, RN 09/23/2015, 1:01 PM

## 2015-09-23 NOTE — Progress Notes (Signed)
Report from [place], [place]. Care assumed for pt at 1515. Pt sitting up in chair, watching TV. Abd lap site dsgs c/d/i. Pt states pain "not bad" 2/10 and tolerable. Denies nausea, no flatus yet. Callbell and personal belongings in reach. Will monitor.

## 2015-09-23 NOTE — Transfer of Care (Signed)
Immediate Anesthesia Transfer of Care Note  Patient: Phillip Deleon  Procedure(s) Performed: Procedure(s): APPENDECTOMY LAPAROSCOPIC (N/A)  Patient Location: PACU  Anesthesia Type:General  Level of Consciousness:  sedated, patient cooperative and responds to stimulation  Airway & Oxygen Therapy:Patient Spontanous Breathing and Patient connected to face mask oxgen  Post-op Assessment:  Report given to PACU RN and Post -op Vital signs reviewed and stable  Post vital signs:  Reviewed and stable  Last Vitals:  Filed Vitals:   09/22/15 2031 09/22/15 2249  BP: 132/75 132/73  Pulse: 71 66  Temp: 36.9 C   Resp: 18 23    Complications: No apparent anesthesia complications

## 2015-09-23 NOTE — Progress Notes (Signed)
Pharmacy Note  After consulting with Will Marlyne Beards PA regarding order for enoxaparin, enoxaparin has been discontinued as pt has a listed allergy to pork products. Enoxaparin is considered contraindicated in patients with pork product allergy, as is heparin.   Thanks,  Adalberto Cole, PharmD, BCPS Pager [phone] 09/23/2015 9:58 AM

## 2015-09-23 NOTE — Op Note (Signed)
Operative Note  Phillip Deleon male 55 y.o. 09/23/2015  PREOPERATIVE DX:  Acute appendicitis  POSTOPERATIVE DX:  Same  PROCEDURE:   Laparoscopic appendectomy         Surgeon: Adolph Pollack   Assistants: None  Anesthesia: General endotracheal anesthesia  Indications:   This is a 54 year old male awoke with upper abdominal pain that persisted and radiated to the right lateral abdomen. He reportedly had some fever and chills. He presented emergency department is noted to have a leukocytosis. CT scan was consistent with acute appendicitis with the appendix lying in the right upper quadrant in the subhepatic area. He is brought to the operating room for laparoscopic possible open appendectomy.    Procedure Detail:  He was brought to the operating room placed supine on the operating table and general anesthetic was administered. A Foley catheter was inserted. An oral gastric tube was inserted. Here on the abdominal wall was clipped. The abdominal wall was widely sterilely prepped and draped. A timeout was performed.  Marcaine was infiltrated in the subumbilical region. A transverse subumbilical incision was made through skin and subcutaneous tissue. An incision was made in the anterior and posterior fascia as well as the peritoneum. The peritoneal cavity was entered under direct vision. The patient had an umbilical hernia but this was not disturbed at this time. A pursestring suture of 0 Vicryl was placed around the edges of the fascia. A Hassan trocar is introduced in the peritoneal cavity and a pneumoperitoneum created by insufflation of CO2 gas.  The laparoscope was introduced and there is no evidence of underlying organ injury or bleeding. A 5 mm trocar was placed through an epigastric incision. A 5 mm trocar was placed in the right lower quadrant.  Identify the transverse colon and traced it to the subhepatic area. At this point I was able to identify the cecum and terminal ileal fat  pad. There was an inflamed appendix heading directly posteriorly then looping around in a J fashion into the mesentery of the ascending colon. I made a defect in the mesentery close to the cecum with the Harmonic scalpel. I then divided part of the mesentery heading posteriorly. I was not able to completely mobilize the mid body of the appendix because it was densely adherent to the mesentery of the transverse colon. I subsequently used a endoscopic stapler to amputate the appendix along with a cuff of cecum.  I added a fourth 5 mm trocar in the right lower quadrant at this time.I dissected some of the mesial appendix away and removed the proximal half of the appendix placing it in a retrieval bag and removing it from the peritoneal cavity. I then grasped the remaining body of appendix and mobilized it using Harmonic scalpel.  I dissected it free from the proximal colon mesoappendix. The rest of the appendix was then placed in the retrieval bag and removed.  This area was copiously irrigated and the fluid was evacuated. There is no evidence of bleeding or organ injury.  The subumbilical trocar was removed and the fascial defect closed under laparoscopic vision by tying down the pursestring suture. The remaining trocars were removed and the pneumoperitoneum was released.  All skin incisions were closed with 4-0 Monocryl subcuticular stitches. Steri-Strips and sterile dressings were applied. He tolerated the procedure well without any apparent complications and was taken to recovery room in satisfactory condition.  Estimated Blood Loss:  150 ml  Specimens: appendix in two pieces        Complications:  * No complications entered in OR log *         Disposition: PACU - hemodynamically stable.         Condition: stable

## 2015-09-23 NOTE — Anesthesia Postprocedure Evaluation (Signed)
Anesthesia Post Note  Patient: Phillip Deleon  Procedure(s) Performed: Procedure(s) (LRB): APPENDECTOMY LAPAROSCOPIC (N/A)  Patient location during evaluation: PACU Anesthesia Type: General Level of consciousness: awake and awake and alert Pain management: pain level controlled Vital Signs Assessment: post-procedure vital signs reviewed and stable Respiratory status: spontaneous breathing, respiratory function stable and nonlabored ventilation Anesthetic complications: no    Last Vitals:  Filed Vitals:   09/23/15 0126 09/23/15 0130  BP: 173/102 169/97  Pulse: 89   Temp:    Resp:      Last Pain:  Filed Vitals:   09/23/15 0136  PainSc: 3                  Kirby Cortese COKER

## 2015-09-24 LAB — CBC
HCT: 38.9 % — ABNORMAL LOW (ref 39.0–52.0)
Hemoglobin: 12.3 g/dL — ABNORMAL LOW (ref 13.0–17.0)
MCH: 30.8 pg (ref 26.0–34.0)
MCHC: 31.6 g/dL (ref 30.0–36.0)
MCV: 97.3 fL (ref 78.0–100.0)
PLATELETS: 165 10*3/uL (ref 150–400)
RBC: 4 MIL/uL — ABNORMAL LOW (ref 4.22–5.81)
RDW: 14 % (ref 11.5–15.5)
WBC: 12.1 10*3/uL — ABNORMAL HIGH (ref 4.0–10.5)

## 2015-09-24 LAB — BASIC METABOLIC PANEL
Anion gap: 7 (ref 5–15)
BUN: 15 mg/dL (ref 6–20)
CALCIUM: 8.6 mg/dL — AB (ref 8.9–10.3)
CO2: 25 mmol/L (ref 22–32)
CREATININE: 0.89 mg/dL (ref 0.61–1.24)
Chloride: 108 mmol/L (ref 101–111)
GFR calc Af Amer: >60 mL/min (ref >60)
Glucose, Bld: 110 mg/dL — ABNORMAL HIGH (ref 65–99)
Potassium: 4.3 mmol/L (ref 3.5–5.1)
SODIUM: 140 mmol/L (ref 135–145)

## 2015-09-24 MED ORDER — ACETAMINOPHEN 325 MG PO TABS: 650.0000 mg | ORAL_TABLET | Freq: Four times a day (QID) | ORAL | Status: DC | PRN

## 2015-09-24 MED ORDER — IBUPROFEN 200 MG PO TABS: ORAL_TABLET | ORAL | Status: DC

## 2015-09-24 MED ORDER — HYDROCODONE-ACETAMINOPHEN 5-325 MG PO TABS: 1.0000 | ORAL_TABLET | ORAL | Status: DC | PRN

## 2015-09-24 NOTE — Progress Notes (Signed)
2 Days Post-Op  Subjective: Still sore he got up a couple times yesterday, but not allot.  Not taking anything for pain.  Sites all look fine.    Objective: Vital signs in last 24 hours: Temp:  [98.4 F (36.9 C)-98.8 F (37.1 C)] 98.5 F (36.9 C) (02/08 0416) Pulse Rate:  [55-65] 65 (02/08 0416) Resp:  [18] 18 (02/08 0416) BP: (110-128)/(55-73) 117/73 mmHg (02/08 0416) SpO2:  [96 %-98 %] 96 % (02/08 0416) Last BM Date: 09/22/15 1080  Urine 2950 Afebrile, VSS WBC better but still up Intake/Output from previous day: 02/07 0701 - 02/08 0700 In: 3775 [P.O.:1080; I.V.:2345; IV Piggyback:350] Out: 2950 [Urine:2950] Intake/Output this shift:    General appearance: alert, cooperative and no distress GI: soft, sore, sites all look good tolerating diet well.   Lab Results:   Recent Labs  09/22/15 1426 09/24/15 0457  WBC 22.3* 12.1*  HGB 15.2 12.3*  HCT 47.1 38.9*  PLT 203 165    BMET  Recent Labs  09/22/15 1426 09/24/15 0457  NA 140 140  K 4.6 4.3  CL 99* 108  CO2 28 25  GLUCOSE 109* 110*  BUN 16 15  CREATININE 1.06 0.89  CALCIUM 10.1 8.6*   PT/INR No results for input(s): LABPROT, INR in the last 72 hours.   Recent Labs Lab 09/22/15 1426  AST 26  ALT 23  ALKPHOS 84  BILITOT 1.0  PROT 8.8*  ALBUMIN 4.9     Lipase     Component Value Date/Time   LIPASE 27 09/22/2015 1426     Studies/Results: Dg Chest 2 View  09/22/2015  CLINICAL DATA:  Preop acute appendicitis.  Former smoker. EXAM: CHEST  2 VIEW COMPARISON:  None. FINDINGS: The heart size and mediastinal contours are within normal limits. Both lungs are clear. The visualized skeletal structures show severe dextro convex scoliosis. IMPRESSION: No active cardiopulmonary disease. Electronically Signed   By: Elsie Stain M.D.   On: 09/22/2015 21:24   Ct Abdomen Pelvis W Contrast  09/22/2015  CLINICAL DATA:  55 year old male with abdominal pain and leukocytosis. EXAM: CT ABDOMEN AND PELVIS WITH  CONTRAST TECHNIQUE: Multidetector CT imaging of the abdomen and pelvis was performed using the standard protocol following bolus administration of intravenous contrast. CONTRAST:  OMNIPAQUE IOHEXOL 300 MG/ML  SOLN COMPARISON:  None. FINDINGS: The visualized lung bases are clear. No intra-abdominal free air or free fluid. Subcentimeter right hepatic hypodense lesion is too small to characterize but may represent a cyst or hemangioma. The liver is otherwise unremarkable. The gallbladder, pancreas, spleen, adrenal glands appear unremarkable. There is a punctate nonobstructing right renal inferior pole calculus. No hydronephrosis. Multiple subcentimeter right renal hypodense lesions are not well characterized but may represent cysts. The left kidney is unremarkable. The visualized ureters are grossly unremarkable. There is apparent diffuse thickening of the bladder wall which may be partly related to underdistention. Cystitis is not excluded. Correlation with urinalysis recommended. The prostate gland is enlarged measuring up to 6.2 cm in transverse axial dimension. There are scattered colonic diverticula without active inflammatory changes. There is no evidence of bowel obstruction. The appendix is enlarged and inflamed. It is located in the right upper abdomen in the posterior right subhepatic space. There is no evidence of perforation or abscess. The abdominal aorta is tortuous. No portal venous gas identified. There is no adenopathy. Small fat containing umbilical hernia. The abdominal wall soft tissues appear unremarkable. There is thoracolumbar scoliosis. No acute fracture. IMPRESSION: Acute appendicitis.  No abscess. Electronically Signed   By: Elgie Collard M.D.   On: 09/22/2015 20:31    Medications: . cefTRIAXone (ROCEPHIN)  IV  2 g Intravenous Q24H   And  . metronidazole  500 mg Intravenous 3 times per day    Assessment/Plan Acute appendicitis/umbilical hernia S/p laparoscopic  appendectomy 09/22/15, Dr. Abbey Chatters Hx of headache Hx of LE DVT Hx of back pain working on disability with SSI Body mass index is 35.8 Antibiotics: Ceftriaxone/Flagyl day 2 DVT: SCD/start Lovenox this PM  Plan:  Home today, follow up in the clinic on 10/15/15.         LOS: 1 day    JENNINGS,WILLARD 09/24/2015  Agree with above. Has done well.  Ovidio Kin, MD, [facility] Surgery Pager: [phone] Office phone:  [phone]

## 2015-09-24 NOTE — Discharge Instructions (Signed)
Laparoscopic Appendectomy, Adult, Care After °Refer to this sheet in the next few weeks. These instructions provide you with information on caring for yourself after your procedure. Your caregiver may also give you more specific instructions. Your treatment has been planned according to current medical practices, but problems sometimes occur. Call your caregiver if you have any problems or questions after your procedure. °HOME CARE INSTRUCTIONS °· Do not drive while taking narcotic pain medicines. °· Use stool softener if you become constipated from your pain medicines. °· Change your bandages (dressings) as directed. °· Keep your wounds clean and dry. You may wash the wounds gently with soap and water. Gently pat the wounds dry with a clean towel. °· Do not take baths, swim, or use hot tubs for 10 days, or as instructed by your caregiver. °· Only take over-the-counter or prescription medicines for pain, discomfort, or fever as directed by your caregiver. °· You may continue your normal diet as directed. °· Do not lift more than 10 pounds (4.5 kg) or play contact sports for 3 weeks, or as directed. °· Slowly increase your activity after surgery. °· Take deep breaths to avoid getting a lung infection (pneumonia). °SEEK MEDICAL CARE IF: °· You have redness, swelling, or increasing pain in your wounds. °· You have pus coming from your wounds. °· You have drainage from a wound that lasts longer than 1 day. °· You notice a bad smell coming from the wounds or dressing. °· Your wound edges break open after stitches (sutures) have been removed. °· You notice increasing pain in the shoulders (shoulder strap areas) or near your shoulder blades. °· You develop dizzy episodes or fainting while standing. °· You develop shortness of breath. °· You develop persistent nausea or vomiting. °· You cannot control your bowel functions or lose your appetite. °· You develop diarrhea. °SEEK IMMEDIATE MEDICAL CARE IF:  °· You have a  fever. °· You develop a rash. °· You have difficulty breathing or sharp pains in your chest. °· You develop any reaction or side effects to medicines given. °MAKE SURE YOU: °· Understand these instructions. °· Will watch your condition. °· Will get help right away if you are not doing well or get worse. °  °This information is not intended to replace advice given to you by your health care provider. Make sure you discuss any questions you have with your health care provider. °  °Document Released: 08/02/2005 Document Revised: 12/17/2014 Document Reviewed: 01/20/2015 °Elsevier Interactive Patient Education ©2016 [employer] ° °CCS ______[facility] °LAPAROSCOPIC SURGERY: POST OP INSTRUCTIONS °Always review your discharge instruction sheet given to you by the facility where your surgery was performed. °IF YOU HAVE DISABILITY OR FAMILY LEAVE FORMS, YOU MUST BRING THEM TO THE OFFICE FOR PROCESSING.   °DO NOT GIVE THEM TO YOUR DOCTOR. ° °1. A prescription for pain medication may be given to you upon discharge.  Take your pain medication as prescribed, if needed.  If narcotic pain medicine is not needed, then you may take acetaminophen (Tylenol) or ibuprofen (Advil) as needed. °2. Take your usually prescribed medications unless otherwise directed. °3. If you need a refill on your pain medication, please contact your pharmacy.  They will contact our office to request authorization. Prescriptions will not be filled after 5pm or on week-ends. °4. You should follow a light diet the first few days after arrival home, such as soup and crackers, etc.  Be sure to include lots of fluids daily. °5. Most   patients will experience some swelling and bruising in the area of the incisions.  Ice packs will help.  Swelling and bruising can take several days to resolve.  °6. It is common to experience some constipation if taking pain medication after surgery.  Increasing fluid intake and taking a stool softener (such as  Colace) will usually help or prevent this problem from occurring.  A mild laxative (Milk of Magnesia or Miralax) should be taken according to package instructions if there are no bowel movements after 48 hours. °7. Unless discharge instructions indicate otherwise, you may remove your bandages 24-48 hours after surgery, and you may shower at that time.  You may have steri-strips (small skin tapes) in place directly over the incision.  These strips should be left on the skin for 7-10 days.  If your surgeon used skin glue on the incision, you may shower in 24 hours.  The glue will flake off over the next 2-3 weeks.  Any sutures or staples will be removed at the office during your follow-up visit. °8. ACTIVITIES:  You may resume regular (light) daily activities beginning the next day--such as daily self-care, walking, climbing stairs--gradually increasing activities as tolerated.  You may have sexual intercourse when it is comfortable.  Refrain from any heavy lifting or straining until approved by your doctor. °a. You may drive when you are no longer taking prescription pain medication, you can comfortably wear a seatbelt, and you can safely maneuver your car and apply brakes. °b. RETURN TO WORK:  __________________________________________________________ °9. You should see your doctor in the office for a follow-up appointment approximately 2-3 weeks after your surgery.  Make sure that you call for this appointment within a day or two after you arrive home to insure a convenient appointment time. °10. OTHER INSTRUCTIONS: __________________________________________________________________________________________________________________________ __________________________________________________________________________________________________________________________ °WHEN TO CALL YOUR DOCTOR: °1. Fever over 101.0 °2. Inability to urinate °3. Continued bleeding from incision. °4. Increased pain, redness, or drainage from the  incision. °5. Increasing abdominal pain ° °The clinic staff is available to answer your questions during regular business hours.  Please don’t hesitate to call and ask to speak to one of the nurses for clinical concerns.  If you have a medical emergency, go to the nearest emergency room or call 911.  A surgeon from [facility] is always on call at the hospital. °[address], [address], [place], [place]  [address] ? [address] [address], [place], [place]   [address] °[phone] ? [phone] ? FAX [phone] °Web site: [email or web address] ° °

## 2015-09-25 ENCOUNTER — Encounter (HOSPITAL_COMMUNITY): Payer: Self-pay | Admitting: General Surgery

## 2015-09-25 NOTE — Progress Notes (Signed)
Physician Discharge Summary  Patient ID: Phillip Deleon MRN: [id number] DOB/AGE: 1960-09-17 56 y.o.  Admit date: 09/22/2015 Discharge date: 09/24/2015  Admission Diagnoses:  Acute appendicitis/umbilical hernia Hx of headache Hx of LE DVT Hx of back pain working on disability with SSI Body mass index is 35.8  Discharge Diagnoses:  Same   Active Problems:   Acute appendicitis   PROCEDURES: S/p laparoscopic appendectomy 09/22/15, Dr. Huntley Dec Course:  He reports the onset of crampy epigastric abdominal pain that began at 7:00 this morning. He tried Tums, Alka-Seltzer, and Pepto-Bismol but did not get relief of his symptoms. His pain persisted and by the time he presented to the emergency department it'd radiated to the right flank. He was noted to have a leukocytosis. CT scan was performed. This demonstrated a dilated inflamed appendix lying in the right upper quadrant subhepatic space consistent with acute appendicitis. For this reason I was asked to see him. He is just getting over an upper respiratory infection. Of note is that he has moved here recently from [place] and currently is living in the [facility].  Pt was seen and admitted on 09/22/15 by Dr. Abbey Chatters and taken to the OR that evening, but it was early AM when he completed surgery.    He tolerated the procedure well. The appendix was adherent to the mesentery of the transverse colon and had to be taken out in 2 pieces. We kept him on IV antibiotics for an additional 24 hours and by the second post op day he was ready to go home.      CBC Latest Ref Rng 09/24/2015 09/22/2015  WBC 4.0 - 10.5 K/uL 12.1(H) 22.3(H)  Hemoglobin 13.0 - 17.0 g/dL 12.3(L) 15.2  Hematocrit 39.0 - 52.0 % 38.9(L) 47.1  Platelets 150 - 400 K/uL 165 203   CMP Latest Ref Rng 09/24/2015 09/22/2015  Glucose 65 - 99 mg/dL 191(Y) 782(N)  BUN 6 - 20 mg/dL 15 16  Creatinine 5.62 - 1.24 mg/dL 1.30 8.65  Sodium 784 - 145 mmol/L 140 140  Potassium  3.5 - 5.1 mmol/L 4.3 4.6  Chloride 101 - 111 mmol/L 108 99(L)  CO2 22 - 32 mmol/L 25 28  Calcium 8.9 - 10.3 mg/dL 6.9(G) 29.5  Total Protein 6.5 - 8.1 g/dL - 8.8(H)  Total Bilirubin 0.3 - 1.2 mg/dL - 1.0  Alkaline Phos 38 - 126 U/L - 84  AST 15 - 41 U/L - 26  ALT 17 - 63 U/L - 23    Condition on D/C:  Improved    Disposition: 01-Home or Self Care     Medication List    STOP taking these medications        ALKA-SELTZER PLS NIGHT CLD/FLU 5-6.25-10-325 MG Caps  Generic drug:  Phenyleph-Doxylamine-DM-APAP     bismuth subsalicylate 262 MG/15ML suspension  Commonly known as:  PEPTO BISMOL     calcium carbonate 750 MG chewable tablet  Commonly known as:  TUMS EX      TAKE these medications        acetaminophen 325 MG tablet  Commonly known as:  TYLENOL  Take 2 tablets (650 mg total) by mouth every 6 (six) hours as needed for mild pain, moderate pain, fever or headache.     HYDROcodone-acetaminophen 5-325 MG tablet  Commonly known as:  NORCO/VICODIN  Take 1-2 tablets by mouth every 4 (four) hours as needed for moderate pain.     ibuprofen 200 MG tablet  Commonly known as:  ADVIL,MOTRIN  You can take 2-3 tablets every 6 hours as needed for pain.           Follow-up Information    Follow up with [facility].   Why:  Go to this clinic any weekday morning from 9-10, bring your pamphlet with you and ask for appts for primary care physician, insurance navigator, and follow up medical care.   Contact information:   [address] [place] [address] [phone]      Follow up with [facility] On 10/15/2015.   Specialty:  General Surgery   Why:  You have an appointment at 11 AM, be at the office 30 minutes before to check in.   Contact information:   [address] [place] [address] [phone]       Signed: Sherrie George 09/25/2015, 12:22 PM  Agree with above.  Ovidio Kin, MD,  [facility] Pager: [phone] Office phone:  [phone]

## 2015-09-29 NOTE — Discharge Summary (Signed)
Physician Discharge Summary  Patient ID: Phillip Deleon MRN: [id number] DOB/AGE: 56-09-1960 56 y.o.  Admit date: 09/22/2015 Discharge date: 09/24/2015  Admission Diagnoses:  Acute appendicitis/umbilical hernia Hx of headache Hx of LE DVT Hx of back pain working on disability with SSI Body mass index is 35.8  Discharge Diagnoses:  Acute appendicitis/umbilical hernia Hx of headache Hx of LE DVT Hx of back pain working on disability with SSI Body mass index is 35.8 Active Problems:   Acute appendicitis   PROCEDURES:  S/p laparoscopic appendectomy 09/22/15, Dr. Huntley Dec Course:   He reports the onset of crampy epigastric abdominal pain that began at 7:00 this morning. He tried Tums, Alka-Seltzer, and Pepto-Bismol but did not get relief of his symptoms. His pain persisted and by the time he presented to the emergency department it'd radiated to the right flank. He was noted to have a leukocytosis. CT scan was performed. This demonstrated a dilated inflamed appendix lying in the right upper quadrant subhepatic space consistent with acute appendicitis. For this reason I was asked to see him. He is just getting over an upper respiratory infection. Of note is that he has moved here recently from [place] and currently is living in the [facility]. Pt admitted by Dr. Abbey Chatters and taken to the OR on 09/23/15.  Pt tolerated the procedure well.  The appendix came out in 2 parts.  We kept the patient on antibiotics for an additional 24 hours.  His diet was advanced and he was ready for discharge the second post op day.         Disposition: 01-Home or Self Care     Medication List    STOP taking these medications        ALKA-SELTZER PLS NIGHT CLD/FLU 5-6.25-10-325 MG Caps  Generic drug:  Phenyleph-Doxylamine-DM-APAP     bismuth subsalicylate 262 MG/15ML suspension  Commonly known as:  PEPTO BISMOL     calcium carbonate 750 MG chewable tablet  Commonly  known as:  TUMS EX      TAKE these medications        acetaminophen 325 MG tablet  Commonly known as:  TYLENOL  Take 2 tablets (650 mg total) by mouth every 6 (six) hours as needed for mild pain, moderate pain, fever or headache.     HYDROcodone-acetaminophen 5-325 MG tablet  Commonly known as:  NORCO/VICODIN  Take 1-2 tablets by mouth every 4 (four) hours as needed for moderate pain.     ibuprofen 200 MG tablet  Commonly known as:  ADVIL,MOTRIN  You can take 2-3 tablets every 6 hours as needed for pain.           Follow-up Information    Follow up with [facility].   Why:  Go to this clinic any weekday morning from 9-10, bring your pamphlet with you and ask for appts for primary care physician, insurance navigator, and follow up medical care.   Contact information:   [address] [place] [address] [phone]      Follow up with [facility] On 10/15/2015.   Specialty:  General Surgery   Why:  You have an appointment at 11 AM, be at the office 30 minutes before to check in.   Contact information:   [address] [place] [address] [phone]       Signed: Sherrie George 09/29/2015, 2:32 PM  Agree with above.  Ovidio Kin, MD, [facility]  Surgery Pager: [phone] Office phone:  [phone]

## 2015-12-23 ENCOUNTER — Encounter (HOSPITAL_BASED_OUTPATIENT_CLINIC_OR_DEPARTMENT_OTHER): Payer: Self-pay | Admitting: Orthopedic Surgery

## 2020-03-15 ENCOUNTER — Emergency Department (HOSPITAL_COMMUNITY)
Admission: EM | Admit: 2020-03-15 | Discharge: 2020-03-16 | Disposition: A | Discharge status: Home / Self Care | Payer: Medicare (Managed Care) | Attending: Emergency Medicine | Admitting: Emergency Medicine

## 2020-03-15 ENCOUNTER — Encounter (HOSPITAL_COMMUNITY): Payer: Self-pay

## 2020-03-15 ENCOUNTER — Other Ambulatory Visit: Payer: Self-pay

## 2020-03-15 ENCOUNTER — Emergency Department (HOSPITAL_COMMUNITY)
Admission: EM | Admit: 2020-03-15 | Discharge: 2020-03-15 | Discharge status: Against Medical Advice | Payer: Medicare (Managed Care)

## 2020-03-15 DIAGNOSIS — R05 Cough: Secondary | ICD-10-CM | POA: Insufficient documentation

## 2020-03-15 DIAGNOSIS — R1084 Generalized abdominal pain: Secondary | ICD-10-CM | POA: Diagnosis present

## 2020-03-15 DIAGNOSIS — K5900 Constipation, unspecified: Secondary | ICD-10-CM | POA: Diagnosis not present

## 2020-03-15 DIAGNOSIS — F17210 Nicotine dependence, cigarettes, uncomplicated: Secondary | ICD-10-CM | POA: Diagnosis not present

## 2020-03-15 DIAGNOSIS — R0789 Other chest pain: Secondary | ICD-10-CM | POA: Diagnosis not present

## 2020-03-15 LAB — URINALYSIS, ROUTINE W REFLEX MICROSCOPIC
Bacteria, UA: NONE SEEN
Bilirubin Urine: NEGATIVE
Glucose, UA: NEGATIVE mg/dL
Ketones, ur: 20 mg/dL — AB
Leukocytes,Ua: NEGATIVE
Nitrite: NEGATIVE
Protein, ur: NEGATIVE mg/dL
Specific Gravity, Urine: 1.023 (ref 1.005–1.030)
pH: 5 (ref 5.0–8.0)

## 2020-03-15 LAB — CBC
HCT: 45.9 % (ref 39.0–52.0)
Hemoglobin: 14.9 g/dL (ref 13.0–17.0)
MCH: 31.2 pg (ref 26.0–34.0)
MCHC: 32.5 g/dL (ref 30.0–36.0)
MCV: 96.2 fL (ref 80.0–100.0)
Platelets: 209 10*3/uL (ref 150–400)
RBC: 4.77 MIL/uL (ref 4.22–5.81)
RDW: 13.1 % (ref 11.5–15.5)
WBC: 9.3 10*3/uL (ref 4.0–10.5)
nRBC: 0 % (ref 0.0–0.2)

## 2020-03-15 LAB — COMPREHENSIVE METABOLIC PANEL
ALT: 23 U/L (ref 0–44)
AST: 18 U/L (ref 15–41)
Albumin: 4.6 g/dL (ref 3.5–5.0)
Alkaline Phosphatase: 69 U/L (ref 38–126)
Anion gap: 10 (ref 5–15)
BUN: 15 mg/dL (ref 6–20)
CO2: 25 mmol/L (ref 22–32)
Calcium: 9.5 mg/dL (ref 8.9–10.3)
Chloride: 103 mmol/L (ref 98–111)
Creatinine, Ser: 1.04 mg/dL (ref 0.61–1.24)
GFR calc Af Amer: >60 mL/min (ref >60)
GFR calc non Af Amer: >60 mL/min (ref >60)
Glucose, Bld: 75 mg/dL (ref 70–99)
Potassium: 3.6 mmol/L (ref 3.5–5.1)
Sodium: 138 mmol/L (ref 135–145)
Total Bilirubin: 0.9 mg/dL (ref 0.3–1.2)
Total Protein: 8.4 g/dL — ABNORMAL HIGH (ref 6.5–8.1)

## 2020-03-15 LAB — LIPASE, BLOOD: Lipase: 44 U/L (ref 11–51)

## 2020-03-15 MED ORDER — SODIUM CHLORIDE 0.9% FLUSH: 3.0000 mL | Freq: Once | INTRAVENOUS | Status: DC

## 2020-03-15 NOTE — ED Triage Notes (Signed)
Pt arrives to ED w/ c/o 7/10 generalized abdominal pain x 4 days. Pt also reports intermittent headache over this time. Pt denies n/v/d. Pt Aox4, neuro intact.

## 2020-03-15 NOTE — ED Triage Notes (Signed)
No answer for triage.

## 2020-03-16 ENCOUNTER — Emergency Department (HOSPITAL_COMMUNITY): Payer: Medicare (Managed Care)

## 2020-03-16 LAB — TROPONIN I (HIGH SENSITIVITY): Troponin I (High Sensitivity): 3 ng/L (ref <18)

## 2020-03-16 MED ORDER — IOHEXOL 300 MG/ML  SOLN
100.0000 mL | Freq: Once | INTRAMUSCULAR | Status: AC | PRN
  Administered 2020-03-16: 100 mL via INTRAVENOUS

## 2020-03-16 MED ORDER — ALUM & MAG HYDROXIDE-SIMETH 200-200-20 MG/5ML PO SUSP
30.0000 mL | Freq: Once | ORAL | Status: AC
  Administered 2020-03-16: 30 mL via ORAL
  Filled 2020-03-16: qty 30

## 2020-03-16 MED ORDER — LIDOCAINE VISCOUS HCL 2 % MT SOLN
15.0000 mL | Freq: Once | OROMUCOSAL | Status: AC
  Administered 2020-03-16: 15 mL via ORAL
  Filled 2020-03-16: qty 15

## 2020-03-16 MED ORDER — POLYETHYLENE GLYCOL 3350 17 G PO PACK: 17.0000 g | PACK | Freq: Every day | ORAL | 0 refills | Status: DC

## 2020-03-16 NOTE — ED Notes (Signed)
Patient transported to CT 

## 2020-03-16 NOTE — ED Provider Notes (Signed)
[facility] Provider Note   CSN: [id number] Arrival date & time: 03/15/20  1724     History Chief Complaint  Patient presents with  . Abdominal Pain    Phillip Deleon is a 59 y.o. male.  The history is provided by the patient and medical records.  Abdominal Pain Pain location:  Generalized Pain quality: aching, bloating, burning and cramping   Pain radiates to:  Chest Pain severity:  Moderate Onset quality:  Gradual Duration:  5 days Timing:  Constant Progression:  Waxing and waning Chronicity:  New Context: previous surgery   Relieved by:  Nothing Worsened by:  Palpation Ineffective treatments:  None tried Associated symptoms: chest pain and cough   Associated symptoms: no anorexia, no chills, no constipation, no diarrhea, no dysuria, no fatigue, no fever, no hematuria, no nausea, no shortness of breath, no sore throat and no vomiting        Past Medical History:  Diagnosis Date  . Headache   . Sinus drainage     Patient Active Problem List   Diagnosis Date Noted  . Acute appendicitis 09/23/2015    Past Surgical History:  Procedure Laterality Date  . FOOT SURGERY  surgery x 2   With hardware placed  . LAPAROSCOPIC APPENDECTOMY N/A 09/22/2015   Procedure: APPENDECTOMY LAPAROSCOPIC;  Surgeon: Avel Peaceodd Rosenbower, MD;  Location: [facility];  Service: General;  Laterality: N/A;       Family History  Problem Relation Age of Onset  . Cancer - Colon Mother     Social History   Tobacco Use  . Smoking status: Former Smoker    Years: 15.00    Types: Cigarettes    Quit date: 09/21/1988    Years since quitting: 31.5  . Smokeless tobacco: Never Used  Substance Use Topics  . Alcohol use: No  . Drug use: No    Home Medications Prior to Admission medications   Medication Sig Start Date End Date Taking? Authorizing Provider  acetaminophen (TYLENOL) 325 MG tablet Take 2 tablets (650 mg total) by mouth every 6 (six) hours as  needed for mild pain, moderate pain, fever or headache. 09/24/15   Sherrie GeorgeJennings, Willard, PA-C  HYDROcodone-acetaminophen (NORCO/VICODIN) 5-325 MG tablet Take 1-2 tablets by mouth every 4 (four) hours as needed for moderate pain. 09/24/15   Sherrie GeorgeJennings, Willard, PA-C  ibuprofen (ADVIL,MOTRIN) 200 MG tablet You can take 2-3 tablets every 6 hours as needed for pain. 09/24/15   Sherrie GeorgeJennings, Willard, PA-C    Allergies    Pork-derived products  Review of Systems   Review of Systems  Constitutional: Negative for chills, diaphoresis, fatigue and fever.  HENT: Negative for congestion, ear pain and sore throat.   Eyes: Negative for pain and visual disturbance.  Respiratory: Positive for cough. Negative for chest tightness, shortness of breath, wheezing and stridor.   Cardiovascular: Positive for chest pain. Negative for palpitations and leg swelling.  Gastrointestinal: Positive for abdominal distention and abdominal pain. Negative for anorexia, constipation, diarrhea, nausea and vomiting.  Genitourinary: Negative for dysuria, flank pain, frequency and hematuria.  Musculoskeletal: Negative for arthralgias, back pain and neck pain.  Skin: Negative for color change and rash.  Neurological: Negative for seizures, syncope, light-headedness and headaches.  Psychiatric/Behavioral: Negative for agitation and confusion.  All other systems reviewed and are negative.   Physical Exam Updated Vital Signs BP (!) 141/79   Pulse 62   Temp 97.9 F (36.6 C) (Oral)   Resp 20   Ht 5'  11" (1.803 m)   Wt (!) 111.1 kg   SpO2 100%   BMI 34.17 kg/m   Physical Exam Vitals and nursing note reviewed.  Constitutional:      General: He is not in acute distress.    Appearance: He is well-developed. He is not ill-appearing, toxic-appearing or diaphoretic.  HENT:     Head: Normocephalic and atraumatic.     Mouth/Throat:     Mouth: Mucous membranes are moist.     Pharynx: No pharyngeal swelling or oropharyngeal exudate.  Eyes:       General: No scleral icterus.    Extraocular Movements: Extraocular movements intact.     Conjunctiva/sclera: Conjunctivae normal.     Pupils: Pupils are equal, round, and reactive to light.  Cardiovascular:     Rate and Rhythm: Normal rate and regular rhythm.     Heart sounds: No murmur heard.   Pulmonary:     Effort: Pulmonary effort is normal. No respiratory distress.     Breath sounds: Normal breath sounds. No wheezing, rhonchi or rales.  Chest:     Chest wall: No tenderness.  Abdominal:     General: Bowel sounds are normal. There is distension.     Palpations: Abdomen is soft.     Tenderness: There is generalized abdominal tenderness. There is no right CVA tenderness, left CVA tenderness, guarding or rebound.     Hernia: A hernia is present. Hernia is present in the umbilical area.  Genitourinary:    Testes:        Right: Tenderness not present.        Left: Tenderness not present.  Musculoskeletal:     Cervical back: Neck supple.  Skin:    General: Skin is warm and dry.  Neurological:     General: No focal deficit present.     Mental Status: He is alert.  Psychiatric:        Mood and Affect: Mood normal.     ED Results / Procedures / Treatments   Labs (all labs ordered are listed, but only abnormal results are displayed) Labs Reviewed  COMPREHENSIVE METABOLIC PANEL - Abnormal; Notable for the following components:      Result Value   Total Protein 8.4 (*)    All other components within normal limits  URINALYSIS, ROUTINE W REFLEX MICROSCOPIC - Abnormal; Notable for the following components:   Hgb urine dipstick SMALL (*)    Ketones, ur 20 (*)    All other components within normal limits  LIPASE, BLOOD  CBC  TROPONIN I (HIGH SENSITIVITY)    EKG EKG Interpretation  Date/Time:  Sunday March 16 2020 10:15:44 EDT Ventricular Rate:  61 PR Interval:    QRS Duration: 91 QT Interval:  429 QTC Calculation: 433 R Axis:   74 Text Interpretation: Sinus rhythm  When comapred to prior, t wave inversion in lead 3 previously is now upright. No sTEMI Confirmed by Theda Belfast ([id number]) on 03/16/2020 11:19:22 AM   Radiology DG Chest 2 View  Result Date: 03/16/2020 CLINICAL DATA:  Chest pain. EXAM: CHEST - 2 VIEW COMPARISON:  09/22/2015 FINDINGS: Cardiac silhouette is normal in size. No mediastinal or hilar masses or evidence of adenopathy. Clear lungs.  No pleural effusion or pneumothorax. Marked dextro thoracic scoliosis.  No acute skeletal abnormality. IMPRESSION: No active cardiopulmonary disease. Electronically Signed   By: Amie Portland M.D.   On: 03/16/2020 10:38   CT ABDOMEN PELVIS W CONTRAST  Result Date: 03/16/2020 CLINICAL DATA:  Abdominal  pain and distension. EXAM: CT ABDOMEN AND PELVIS WITH CONTRAST TECHNIQUE: Multidetector CT imaging of the abdomen and pelvis was performed using the standard protocol following bolus administration of intravenous contrast. CONTRAST:  OMNIPAQUE IOHEXOL 300 MG/ML  SOLN COMPARISON:  09/22/2015 FINDINGS: Lower chest: Limited visualization of the lower thorax is negative for focal airspace opacity or pleural effusion. Normal heart size. No pericardial effusion. Hepatobiliary: Normal hepatic contour. There is a minimal amount of focal fatty infiltration adjacent to the fissure for the ligamentum teres. Scattered punctate hypoattenuating hepatic lesions are too small to accurately characterize though favored to represent hepatic cysts. Normal appearance of the gallbladder given degree of distention. No radiopaque gallstones. No intra or extrahepatic biliary ductal dilatation. No ascites. Pancreas: Normal appearance of the pancreas. Spleen: Normal appearance of the spleen. Note is made of a small splenule. Adrenals/Urinary Tract: There is symmetric enhancement and excretion of the bilateral kidneys. Punctate subcentimeter hypoattenuating right-sided renal lesions are too small to adequately characterize though favored to  represent renal cysts. No discrete left-sided renal lesions. There is a punctate (approximately 2 mm) nonobstructing stone within the inferior pole of the right kidney (coronal image 64, series 3). No evidence of left-sided nephrolithiasis on this postcontrast examination. No urinary obstruction or perinephric stranding. Potential tiny amount of air within the nondependent portion of the urinary bladder, potentially the sequela previous in and out Foley catheterization. Otherwise, normal appearance of the urinary bladder given degree distention. Normal appearance bilateral adrenal glands. Stomach/Bowel: Large colonic stool burden without evidence of enteric obstruction. Normal appearance of the terminal ileum. Post appendectomy. No discrete areas of bowel wall thickening. No pneumoperitoneum, pneumatosis or portal venous gas. Vascular/Lymphatic: Scattered atherosclerotic plaque within a tortuous but normal caliber abdominal aorta. The major branch vessels of the abdominal aorta appear patent this non CTA examination. Isolated enlarged right pelvic sidewall lymph node measures 1.2 cm in greatest short axis diameter (image 70, series 3, grossly unchanged compared to the 09/2015 examination and thus presumably reactive in etiology. Additional scattered retroperitoneal and pelvic lymph nodes are not enlarged by size criteria and also favored to be reactive in etiology. No bulky retroperitoneal mesenteric pelvic or inguinal lymphadenopathy. Reproductive: Borderline prostatomegaly. No free fluid within the pelvic cul-de-sac. Other: Small to moderate-sized mesenteric fat containing umbilical hernia measuring approximately 3.9 x 3.2 x 3.2 cm (sagittal image 75, series 7; axial image 55, series 3), unchanged compared to the 2017 examination. Left-sided gynecomastia (image 1, series 3). The right subareolar region was not imaged. 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 IMPRESSION: 1. Large colonic stool burden without evidence of enteric obstruction. Otherwise, no explanation patient's abdominal pain and distension. No evidence of urinary obstruction. Post appendectomy. 2. Solitary punctate (approximately 2 mm) nonobstructing right-sided renal stone similar to the 2017 examination. 3. Prostatomegaly. If not recently performed, further evaluation with DRE is advised. 4. 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 5. Incidentally noted left-sided gynecomastia. Note, the right-sided sub areolar region was not imaged due to severe scoliotic curvature. 6.  Aortic Atherosclerosis (ICD10-I70.0). Electronically Signed   By: Simonne Come M.D.   On: 03/16/2020 11:59    Procedures Procedures (including critical care time)  Medications Ordered in ED Medications  sodium chloride flush (NS) 0.9 % injection 3 mL (has no administration in time range)  alum & mag hydroxide-simeth (MAALOX/MYLANTA) 200-200-20 MG/5ML suspension 30 mL (30 mLs Oral Given 03/16/20 1059)    And  lidocaine (XYLOCAINE) 2 % viscous mouth solution 15 mL (15 mLs Oral Given 03/16/20 1059)  iohexol (OMNIPAQUE) 300 MG/ML solution 100 mL (100 mLs Intravenous Contrast Given 03/16/20 1129)    ED Course  I have reviewed the triage vital signs and the nursing notes.  Pertinent labs & imaging results that were available during my care of the patient were reviewed by me and considered in my medical decision making (see chart for details).    MDM Rules/Calculators/A&P                          Phillip Deleon is a 59 y.o. male with a past medical history significant for chronic headaches and sinus problems as well as prior appendectomy who presents with abdominal pain.  He reports that for the last 5 days, he has had severe pain in his abdomen.   He reports the pain is both aching and burning in his upper abdomen and mid abdomen as well as some into his chest.  He denies nausea or vomiting, constipation, diarrhea, or urinary changes.  He says that he is still passing gas but his abdomen feels very distended and bloated for him.  He denies history of obstructions.  He has had abdominal surgery before prior appendicitis.  He denies any new palpitations, shortness of breath, or syncope.  Denies any trauma.  He denies eating any spicy food recently.  Denies history of significant reflux.  He reports has not taken any medicine for his symptoms.  He reports the pain was up to an 8 or 9 out of 10 in severity initially but is now a 6 out of 10.  He has been in the emergency department for approximately 16-1/2 hours before I saw the patient this morning.  On exam, lungs are clear and chest is nontender.  No murmur.  Good pulses in all extremities.  Legs are nontender nonedematous.  Abdomen is diffusely tender and he reports it is very distended for him.  It is slightly bloated.  Back and flanks nontender.  Normal bowel sounds appreciated.  He does have a nontender umbilical hernia which she reports is unchanged from baseline for years.  Given the patient's report that he has severe abdominal pain and it is distended, we will get imaging to rule out a partial obstruction or obstruction.  He will get a chest x-ray given the component of chest discomfort as well as an EKG and troponin.  He had screening labs overnight with a CBC CMP and lipase which were all normal.  He denies any urinary symptoms.  He will be given GI cocktail as the burning description of symptoms does sound more esophageal or reflux related.  We will see if this helps.  We will get the imaging of his chest and abdomen as well as the other labs.  If work-up is reassuring, dissipate discharge home with plans to follow-up with a GI and his PCP.  Given his lack of radiation to his back, not  significantly elevated blood pressures, and lack of neurologic deficits and good pulses, low suspicion for an aortic dissection or aortic cause of symptoms.  Will hold on dissection study.  Anticipate reassessment.  CT scan shows large stool burden but otherwise did not show any acute abnormalities.  We discussed the large prostate which he knows about and some  gynecomastia he can follow-up with his PCP for.  Otherwise his labs and work-up are reassuring.  Patient agreed with plan for MiraLAX initiation and PCP follow-up.  He understands return precautions and follow-up instructions.  Patient discharged in good condition with improving symptoms.   Final Clinical Impression(s) / ED Diagnoses Final diagnoses:  Generalized abdominal pain  Constipation, unspecified constipation type    Rx / DC Orders ED Discharge Orders         Ordered    polyethylene glycol (MIRALAX) 17 g packet  Daily     Discontinue  Reprint     03/16/20 1447          Clinical Impression: 1. Generalized abdominal pain   2. Constipation, unspecified constipation type     Disposition: Discharge  Condition: Good  I have discussed the results, Dx and Tx plan with the pt(& family if present). He/she/they expressed understanding and agree(s) with the plan. Discharge instructions discussed at great length. Strict return precautions discussed and pt &/or family have verbalized understanding of the instructions. No further questions at time of discharge.    New Prescriptions   POLYETHYLENE GLYCOL (MIRALAX) 17 G PACKET    Take 17 g by mouth daily.    Follow Up: [facility] [address] [place] [address] [phone] Schedule an appointment as soon as possible for a visit    [facility] EMERGENCY DEPARTMENT [address] [id number] mc [place] [address] [phone]       Jonell Krontz, Canary Brim, MD 03/16/20  1453

## 2020-03-16 NOTE — Discharge Instructions (Signed)
Your work-up today was overall reassuring but we did find a large amount of stool likely causing your abdominal distention and discomfort.  Please use MiraLAX for the neck several days to try to encourage a bowel movement and stay hydrated.  Please follow-up with your primary doctor for both reassessment and for further evaluation of the gynecomastia and your enlarged prostate.  Please rest.  If any symptoms change or worsen, please return to the nearest emergency department.

## 2020-08-28 ENCOUNTER — Other Ambulatory Visit (HOSPITAL_COMMUNITY): Payer: Self-pay | Admitting: Emergency Medicine

## 2020-08-28 ENCOUNTER — Encounter (HOSPITAL_COMMUNITY): Payer: Self-pay

## 2020-08-28 ENCOUNTER — Other Ambulatory Visit: Payer: Self-pay

## 2020-08-28 ENCOUNTER — Emergency Department (HOSPITAL_COMMUNITY)
Admission: EM | Admit: 2020-08-28 | Discharge: 2020-08-28 | Disposition: A | Discharge status: Home / Self Care | Payer: Medicare HMO | Attending: Emergency Medicine | Admitting: Emergency Medicine

## 2020-08-28 DIAGNOSIS — R1013 Epigastric pain: Secondary | ICD-10-CM | POA: Insufficient documentation

## 2020-08-28 DIAGNOSIS — Z87891 Personal history of nicotine dependence: Secondary | ICD-10-CM | POA: Diagnosis not present

## 2020-08-28 DIAGNOSIS — R519 Headache, unspecified: Secondary | ICD-10-CM | POA: Diagnosis not present

## 2020-08-28 DIAGNOSIS — R109 Unspecified abdominal pain: Secondary | ICD-10-CM | POA: Diagnosis present

## 2020-08-28 LAB — CBC
HCT: 48.1 % (ref 39.0–52.0)
Hemoglobin: 15.1 g/dL (ref 13.0–17.0)
MCH: 30.5 pg (ref 26.0–34.0)
MCHC: 31.4 g/dL (ref 30.0–36.0)
MCV: 97.2 fL (ref 80.0–100.0)
Platelets: 223 10*3/uL (ref 150–400)
RBC: 4.95 MIL/uL (ref 4.22–5.81)
RDW: 13.3 % (ref 11.5–15.5)
WBC: 9.2 10*3/uL (ref 4.0–10.5)
nRBC: 0 % (ref 0.0–0.2)

## 2020-08-28 LAB — COMPREHENSIVE METABOLIC PANEL
ALT: 21 U/L (ref 0–44)
AST: 17 U/L (ref 15–41)
Albumin: 4.3 g/dL (ref 3.5–5.0)
Alkaline Phosphatase: 71 U/L (ref 38–126)
Anion gap: 11 (ref 5–15)
BUN: 7 mg/dL (ref 6–20)
CO2: 26 mmol/L (ref 22–32)
Calcium: 9.4 mg/dL (ref 8.9–10.3)
Chloride: 103 mmol/L (ref 98–111)
Creatinine, Ser: 1 mg/dL (ref 0.61–1.24)
GFR, Estimated: >60 mL/min (ref >60)
Glucose, Bld: 87 mg/dL (ref 70–99)
Potassium: 3.8 mmol/L (ref 3.5–5.1)
Sodium: 140 mmol/L (ref 135–145)
Total Bilirubin: 1.5 mg/dL — ABNORMAL HIGH (ref 0.3–1.2)
Total Protein: 8 g/dL (ref 6.5–8.1)

## 2020-08-28 LAB — URINALYSIS, ROUTINE W REFLEX MICROSCOPIC
Bacteria, UA: NONE SEEN
Bilirubin Urine: NEGATIVE
Glucose, UA: NEGATIVE mg/dL
Ketones, ur: NEGATIVE mg/dL
Leukocytes,Ua: NEGATIVE
Nitrite: NEGATIVE
Protein, ur: NEGATIVE mg/dL
Specific Gravity, Urine: 1.016 (ref 1.005–1.030)
pH: 6 (ref 5.0–8.0)

## 2020-08-28 LAB — LIPASE, BLOOD: Lipase: 30 U/L (ref 11–51)

## 2020-08-28 MED ORDER — OMEPRAZOLE 20 MG PO CPDR
20.0000 mg | DELAYED_RELEASE_CAPSULE | Freq: Every day | ORAL | 1 refills | Status: DC
Start: 2020-08-28 — End: 2020-10-08

## 2020-08-28 MED ORDER — ALUM & MAG HYDROXIDE-SIMETH 200-200-20 MG/5ML PO SUSP
30.0000 mL | Freq: Once | ORAL | Status: AC
  Administered 2020-08-28: 30 mL via ORAL
  Filled 2020-08-28: qty 30

## 2020-08-28 MED ORDER — ACETAMINOPHEN 325 MG PO TABS
650.0000 mg | ORAL_TABLET | Freq: Once | ORAL | Status: AC
  Administered 2020-08-28: 650 mg via ORAL
  Filled 2020-08-28: qty 2

## 2020-08-28 MED FILL — OMEPRAZOLE DR 20 MG CAPSULE: 20 | 30 days supply | Qty: 30 | Fill #0

## 2020-08-28 NOTE — ED Provider Notes (Signed)
[facility] EMERGENCY DEPARTMENT Provider Note   CSN: [id number] Arrival date & time: 08/28/20  1020     History Chief Complaint  Patient presents with  . Abdominal Pain  . Headache    Phillip Deleon is a 60 y.o. male.  HPI     This is a 60 year old male with with no significant past medical history who presents with headache and abdominal pain.  Patient reports 2 to 3-day history of both headache and abdominal pain.  He states that the headache is dull and intermittent.  He is currently without a headache.  No associated weakness, numbness, strokelike symptoms.  No vision changes.  Denies history of headaches in the past; however, has a history of headaches listed in his past medical history.  Regarding his abdominal pain, he describes it as burning.  Not necessarily associated with eating.  Is currently without abdominal pain.  Reports normal bowel movements and no vomiting or diarrhea.  No fevers.  No known sick contacts.  Past Medical History:  Diagnosis Date  . Headache   . Sinus drainage     Patient Active Problem List   Diagnosis Date Noted  . Acute appendicitis 09/23/2015    Past Surgical History:  Procedure Laterality Date  . FOOT SURGERY  surgery x 2   With hardware placed  . LAPAROSCOPIC APPENDECTOMY N/A 09/22/2015   Procedure: APPENDECTOMY LAPAROSCOPIC;  Surgeon: Avel Peace, MD;  Location: [facility];  Service: General;  Laterality: N/A;       Family History  Problem Relation Age of Onset  . Cancer - Colon Mother     Social History   Tobacco Use  . Smoking status: Former Smoker    Years: 15.00    Types: Cigarettes    Quit date: 09/21/1988    Years since quitting: 31.9  . Smokeless tobacco: Never Used  Substance Use Topics  . Alcohol use: No  . Drug use: No    Home Medications Prior to Admission medications   Medication Sig Start Date End Date Taking? Authorizing Provider  omeprazole (PRILOSEC) 20 MG capsule Take 1 capsule  (20 mg total) by mouth daily. 08/28/20  Yes Tanette Chauca, Mayer Masker, MD  acetaminophen (TYLENOL) 325 MG tablet Take 2 tablets (650 mg total) by mouth every 6 (six) hours as needed for mild pain, moderate pain, fever or headache. 09/24/15   Sherrie George, PA-C  HYDROcodone-acetaminophen (NORCO/VICODIN) 5-325 MG tablet Take 1-2 tablets by mouth every 4 (four) hours as needed for moderate pain. 09/24/15   Sherrie George, PA-C  ibuprofen (ADVIL,MOTRIN) 200 MG tablet You can take 2-3 tablets every 6 hours as needed for pain. 09/24/15   Sherrie George, PA-C  polyethylene glycol (MIRALAX) 17 g packet Take 17 g by mouth daily. 03/16/20   Tegeler, Canary Brim, MD    Allergies    Pork-derived products  Review of Systems   Review of Systems  Constitutional: Negative for fever.  Eyes: Positive for visual disturbance.  Respiratory: Negative for shortness of breath.   Cardiovascular: Negative for chest pain.  Gastrointestinal: Positive for abdominal pain. Negative for diarrhea, nausea and vomiting.  Genitourinary: Negative for dysuria.  Neurological: Positive for headaches. Negative for dizziness, weakness and numbness.  All other systems reviewed and are negative.   Physical Exam Updated Vital Signs BP (!) 150/89 (BP Location: Right Arm)   Pulse 69   Temp 99 F (37.2 C) (Oral)   Resp 18   Ht 1.727 m (5\' 8" )   Wt  108.9 kg   SpO2 97%   BMI 36.49 kg/m   Physical Exam Vitals and nursing note reviewed.  Constitutional:      Appearance: He is well-developed and well-nourished. He is obese.  HENT:     Head: Normocephalic and atraumatic.  Eyes:     Pupils: Pupils are equal, round, and reactive to light.  Cardiovascular:     Rate and Rhythm: Normal rate and regular rhythm.     Heart sounds: Normal heart sounds. No murmur heard.   Pulmonary:     Effort: Pulmonary effort is normal. No respiratory distress.     Breath sounds: Normal breath sounds. No wheezing.  Abdominal:     General: Bowel  sounds are normal.     Palpations: Abdomen is soft.     Tenderness: There is no abdominal tenderness. There is no rebound.     Comments: Abdomen soft, nontender, umbilical hernia noted, easily reducible, no overlying skin changes  Musculoskeletal:        General: No edema.     Cervical back: Neck supple.  Lymphadenopathy:     Cervical: No cervical adenopathy.  Skin:    General: Skin is warm and dry.  Neurological:     General: No focal deficit present.     Mental Status: He is alert and oriented to person, place, and time.  Psychiatric:        Mood and Affect: Mood and affect and mood normal.     ED Results / Procedures / Treatments   Labs (all labs ordered are listed, but only abnormal results are displayed) Labs Reviewed  COMPREHENSIVE METABOLIC PANEL - Abnormal; Notable for the following components:      Result Value   Total Bilirubin 1.5 (*)    All other components within normal limits  URINALYSIS, ROUTINE W REFLEX MICROSCOPIC - Abnormal; Notable for the following components:   Hgb urine dipstick SMALL (*)    All other components within normal limits  LIPASE, BLOOD  CBC    EKG None  Radiology No results found.  Procedures Procedures (including critical care time)  Medications Ordered in ED Medications  acetaminophen (TYLENOL) tablet 650 mg (has no administration in time range)  alum & mag hydroxide-simeth (MAALOX/MYLANTA) 200-200-20 MG/5ML suspension 30 mL (30 mLs Oral Given 08/28/20 1148)    ED Course  I have reviewed the triage vital signs and the nursing notes.  Pertinent labs & imaging results that were available during my care of the patient were reviewed by me and considered in my medical decision making (see chart for details).    MDM Rules/Calculators/A&P                          Patient presents with intermittent headache and abdominal pain.  He is overall nontoxic and vital signs are reassuring.  Blood pressure was noted to be slightly elevated  at 150/89.  Patient is essentially asymptomatic at this time.  His abdominal exam is benign.  He does describe the pain as burning and locates it in the epigastrium.  This makes me question whether he may have some reflux although he does not associate it with food.  Denies history of drinking.  Pancreatitis or gallbladder pathology are also considerations.  Regarding his headache, he is currently without headache.  He describes it as pressure and intermittent.  No red flags.  No focal neurologic symptoms.  Do not feel he needs imaging at this time.  Patient  was given Tylenol and a GI cocktail.  He remained asymptomatic.  Work-up sent from triage reviewed.  No significant leukocytosis.  No metabolic derangements or LFT abnormalities.  Lipase is normal which would argue against pancreatitis.  Urinalysis is reassuring.  Given that the patient is relatively asymptomatic and symptoms are intermittent, will start on omeprazole to cover for reflux and recommend Tylenol for headache.  He does not have a primary care physician.  We will provide resources for primary care follow-up.  After history, exam, and medical workup I feel the patient has been appropriately medically screened and is safe for discharge home. Pertinent diagnoses were discussed with the patient. Patient was given return precautions.  Final Clinical Impression(s) / ED Diagnoses Final diagnoses:  Epigastric pain  Nonintractable episodic headache, unspecified headache type    Rx / DC Orders ED Discharge Orders         Ordered    omeprazole (PRILOSEC) 20 MG capsule  Daily        08/28/20 1220           Shon Baton, MD 08/28/20 1222

## 2020-08-28 NOTE — Discharge Instructions (Signed)
You were seen today for abdominal pain and headache.  Your work-up is reassuring.  Start medications as prescribed.  Take Tylenol for any headaches.  Follow-up to establish primary care.

## 2020-08-28 NOTE — ED Triage Notes (Signed)
Pt reports mid abd pain for the past 3 days, denies n/v/d. Describes the pain as burning. Pt also reports intermittent headaches for the past 3 days. Pt a.o, nad noted

## 2020-09-18 ENCOUNTER — Ambulatory Visit: Payer: Medicare HMO | Admitting: Internal Medicine

## 2020-09-18 DIAGNOSIS — Z0289 Encounter for other administrative examinations: Secondary | ICD-10-CM

## 2020-10-08 ENCOUNTER — Other Ambulatory Visit: Payer: Self-pay

## 2020-10-08 ENCOUNTER — Encounter (HOSPITAL_COMMUNITY): Payer: Self-pay

## 2020-10-08 ENCOUNTER — Ambulatory Visit (HOSPITAL_COMMUNITY)
Admission: EM | Admit: 2020-10-08 | Discharge: 2020-10-08 | Disposition: A | Discharge status: Home / Self Care | Payer: Medicare HMO | Attending: Family Medicine | Admitting: Family Medicine

## 2020-10-08 DIAGNOSIS — Z760 Encounter for issue of repeat prescription: Secondary | ICD-10-CM

## 2020-10-08 DIAGNOSIS — Z87891 Personal history of nicotine dependence: Secondary | ICD-10-CM | POA: Insufficient documentation

## 2020-10-08 DIAGNOSIS — K219 Gastro-esophageal reflux disease without esophagitis: Secondary | ICD-10-CM | POA: Diagnosis not present

## 2020-10-08 DIAGNOSIS — J069 Acute upper respiratory infection, unspecified: Secondary | ICD-10-CM

## 2020-10-08 DIAGNOSIS — Z79899 Other long term (current) drug therapy: Secondary | ICD-10-CM | POA: Insufficient documentation

## 2020-10-08 DIAGNOSIS — Z20822 Contact with and (suspected) exposure to covid-19: Secondary | ICD-10-CM | POA: Diagnosis not present

## 2020-10-08 MED ORDER — CETIRIZINE HCL 10 MG PO TABS: 10.0000 mg | ORAL_TABLET | Freq: Every day | ORAL | 0 refills | Status: AC

## 2020-10-08 MED ORDER — PSEUDOEPHEDRINE HCL 30 MG PO TABS: 30.0000 mg | ORAL_TABLET | Freq: Three times a day (TID) | ORAL | 0 refills | Status: AC | PRN

## 2020-10-08 MED ORDER — BENZONATATE 100 MG PO CAPS: 100.0000 mg | ORAL_CAPSULE | Freq: Three times a day (TID) | ORAL | 0 refills | Status: AC | PRN

## 2020-10-08 MED ORDER — OMEPRAZOLE 20 MG PO CPDR
20.0000 mg | DELAYED_RELEASE_CAPSULE | Freq: Every day | ORAL | 1 refills | Status: AC
Start: 2020-10-08 — End: ?

## 2020-10-08 MED ORDER — PROMETHAZINE-DM 6.25-15 MG/5ML PO SYRP
5.0000 mL | ORAL_SOLUTION | Freq: Every evening | ORAL | 0 refills | Status: AC | PRN
Start: 2020-10-08 — End: ?

## 2020-10-08 NOTE — ED Provider Notes (Signed)
[facility]   MRN: [id number] DOB: Aug 12, 1961  Subjective:   Phillip Deleon is a 60 y.o. male presenting for 2-day history of chills, sinus pressure, congestion and drainage.  Patient has also had a few episodes of acid reflux.  States that he has been eating fried food and he knows he should not be doing this.  Needs a refill of his Prilosec to help with this.  Plans on making diet changes as well.  Denies fever, sinus pain, ear pain, sore throat, cough, chest pain, shortness of breath, nausea, vomiting, diarrhea, bloody stools.  Denies constipation, states he has about 1 bowel movement a day.  Denies history of GI issues.  Does not have a PCP.  No current facility-administered medications for this encounter.  Current Outpatient Medications:  .  omeprazole (PRILOSEC) 20 MG capsule, Take 1 capsule (20 mg total) by mouth daily., Disp: 30 capsule, Rfl: 1   Allergies  Allergen Reactions  . Pork-Derived Products     Blood pressure goes up really high, gets slurred speak    Past Medical History:  Diagnosis Date  . Headache   . Sinus drainage      Past Surgical History:  Procedure Laterality Date  . FOOT SURGERY  surgery x 2   With hardware placed  . LAPAROSCOPIC APPENDECTOMY N/A 09/22/2015   Procedure: APPENDECTOMY LAPAROSCOPIC;  Surgeon: Avel Peace, MD;  Location: [facility];  Service: General;  Laterality: N/A;    Family History  Problem Relation Age of Onset  . Cancer - Colon Mother     Social History   Tobacco Use  . Smoking status: Former Smoker    Years: 15.00    Types: Cigarettes    Quit date: 09/21/1988    Years since quitting: 32.0  . Smokeless tobacco: Never Used  Substance Use Topics  . Alcohol use: No  . Drug use: No    ROS   Objective:   Vitals: BP 115/69 (BP Location: Left Arm)   Pulse 67   Temp 98.5 F (36.9 C) (Oral)   Resp 17   SpO2 100%   Physical Exam Constitutional:      General: He is not in acute distress.     Appearance: Normal appearance. He is well-developed and well-nourished. He is not ill-appearing, toxic-appearing or diaphoretic.  HENT:     Head: Normocephalic and atraumatic.     Right Ear: External ear normal.     Left Ear: External ear normal.     Nose: Nose normal.     Mouth/Throat:     Mouth: Oropharynx is clear and moist. Mucous membranes are moist.     Pharynx: No oropharyngeal exudate or posterior oropharyngeal erythema.  Eyes:     General: No scleral icterus.    Extraocular Movements: Extraocular movements intact.     Conjunctiva/sclera: Conjunctivae normal.     Pupils: Pupils are equal, round, and reactive to light.  Cardiovascular:     Rate and Rhythm: Normal rate and regular rhythm.     Pulses: Intact distal pulses.     Heart sounds: Normal heart sounds. No murmur heard. No friction rub. No gallop.   Pulmonary:     Effort: Pulmonary effort is normal. No respiratory distress.     Breath sounds: Normal breath sounds. No stridor. No wheezing, rhonchi or rales.  Abdominal:     General: Bowel sounds are normal. There is no distension.     Palpations: Abdomen is soft. There is no mass.  Tenderness: There is no abdominal tenderness. There is no right CVA tenderness, left CVA tenderness, guarding or rebound.  Skin:    General: Skin is warm and dry.  Neurological:     Mental Status: He is alert and oriented to person, place, and time.     Cranial Nerves: No cranial nerve deficit.     Motor: No weakness.     Coordination: Romberg sign negative. Coordination normal.     Gait: Gait normal.  Psychiatric:        Mood and Affect: Mood and affect and mood normal.        Behavior: Behavior normal.        Thought Content: Thought content normal.        Judgment: Judgment normal.       Assessment and Plan :   PDMP not reviewed this encounter.  1. Viral URI   2. Encounter for medication refill   3. Gastroesophageal reflux disease without esophagitis     Patient has  reassuring physical exam findings, vital signs.  Suspect viral URI but will make sure its not COVID-19, use supportive care.  Encouraged patient to continue on his dietary modifications and avoid GERD causing foods including eating fried foods regularly.  Refilled his omeprazole.  Recommended follow-up and establishing care with a new PCP, information given to him for establishing care. Counseled patient on potential for adverse effects with medications prescribed/recommended today, ER and return-to-clinic precautions discussed, patient verbalized understanding.    Wallis Bamberg, PA-C 10/08/20 1416

## 2020-10-08 NOTE — ED Triage Notes (Signed)
Pt presents with chills, pressure in head and stomach pain X 2 days. Pt states he is here for a refill on Omeprazole.

## 2020-10-09 LAB — SARS CORONAVIRUS 2 (TAT 6-24 HRS): SARS Coronavirus 2: NEGATIVE

## 2020-11-11 ENCOUNTER — Ambulatory Visit: Payer: Medicare HMO | Admitting: Internal Medicine

## 2020-12-18 ENCOUNTER — Ambulatory Visit: Payer: Medicare HMO | Admitting: Internal Medicine

## 2021-01-06 ENCOUNTER — Other Ambulatory Visit: Payer: Self-pay

## 2021-01-06 ENCOUNTER — Emergency Department (HOSPITAL_COMMUNITY): Payer: Medicare HMO

## 2021-01-06 ENCOUNTER — Emergency Department (HOSPITAL_COMMUNITY)
Admission: EM | Admit: 2021-01-06 | Discharge: 2021-01-06 | Disposition: A | Discharge status: Home / Self Care | Payer: Medicare HMO | Attending: Emergency Medicine | Admitting: Emergency Medicine

## 2021-01-06 DIAGNOSIS — R0981 Nasal congestion: Secondary | ICD-10-CM

## 2021-01-06 DIAGNOSIS — Z87891 Personal history of nicotine dependence: Secondary | ICD-10-CM | POA: Diagnosis not present

## 2021-01-06 DIAGNOSIS — R42 Dizziness and giddiness: Secondary | ICD-10-CM | POA: Diagnosis not present

## 2021-01-06 DIAGNOSIS — R519 Headache, unspecified: Secondary | ICD-10-CM | POA: Diagnosis not present

## 2021-01-06 LAB — URINALYSIS, ROUTINE W REFLEX MICROSCOPIC
Bilirubin Urine: NEGATIVE
Glucose, UA: NEGATIVE mg/dL
Hgb urine dipstick: NEGATIVE
Ketones, ur: NEGATIVE mg/dL
Leukocytes,Ua: NEGATIVE
Nitrite: NEGATIVE
Protein, ur: NEGATIVE mg/dL
Specific Gravity, Urine: 1.015 (ref 1.005–1.030)
pH: 6 (ref 5.0–8.0)

## 2021-01-06 LAB — CBC
HCT: 45 % (ref 39.0–52.0)
Hemoglobin: 13.7 g/dL (ref 13.0–17.0)
MCH: 30.3 pg (ref 26.0–34.0)
MCHC: 30.4 g/dL (ref 30.0–36.0)
MCV: 99.6 fL (ref 80.0–100.0)
Platelets: 191 10*3/uL (ref 150–400)
RBC: 4.52 MIL/uL (ref 4.22–5.81)
RDW: 13.3 % (ref 11.5–15.5)
WBC: 7.8 10*3/uL (ref 4.0–10.5)
nRBC: 0 % (ref 0.0–0.2)

## 2021-01-06 LAB — BASIC METABOLIC PANEL
Anion gap: 7 (ref 5–15)
BUN: 7 mg/dL (ref 6–20)
CO2: 31 mmol/L (ref 22–32)
Calcium: 9 mg/dL (ref 8.9–10.3)
Chloride: 101 mmol/L (ref 98–111)
Creatinine, Ser: 0.86 mg/dL (ref 0.61–1.24)
GFR, Estimated: >60 mL/min (ref >60)
Glucose, Bld: 91 mg/dL (ref 70–99)
Potassium: 3.5 mmol/L (ref 3.5–5.1)
Sodium: 139 mmol/L (ref 135–145)

## 2021-01-06 MED ORDER — ACETAMINOPHEN 500 MG PO TABS
1000.0000 mg | ORAL_TABLET | Freq: Once | ORAL | Status: AC
  Administered 2021-01-06: 1000 mg via ORAL
  Filled 2021-01-06: qty 2

## 2021-01-06 NOTE — ED Notes (Signed)
Returned from CT.

## 2021-01-06 NOTE — ED Notes (Signed)
Patient ambulated in hallway, states dizziness and pressure left head still present, but does not increase or change with ambulation. Drinking fluids.

## 2021-01-06 NOTE — ED Notes (Signed)
ED Provider at bedside. 

## 2021-01-06 NOTE — ED Provider Notes (Signed)
[facility] EMERGENCY DEPARTMENT Provider Note   CSN: [id number] Arrival date & time: 01/06/21  0745     History Chief Complaint  Patient presents with  . Dizziness    Phillip Deleon is a 60 y.o. male.  Patient indicates felt lightheaded, and congest in head since yesterday. Notes unsure if sinus related or other issue. States feel mild pressure/congestion type headache. No acute, abrupt or severe head pain. Feels mildly lightheaded esp if/when stands. No syncope. Denies room spinning sensation. No hearing loss, ear pain or tinnitus. Denies sinus drainage or frontal/maxillary sinus pain.  No fever or chills. Denies problems w balance or gait. No falls. No change in speech or vision. No numbness or weakness or loss of normal functional ability. Denies blood loss, rectal bleeding or melena. No new meds or change in meds.   The history is provided by the patient.  Dizziness Associated symptoms: no blood in stool, no chest pain, no diarrhea, no headaches, no palpitations, no shortness of breath, no vomiting and no weakness        Past Medical History:  Diagnosis Date  . Headache   . Sinus drainage     Patient Active Problem List   Diagnosis Date Noted  . Acute appendicitis 09/23/2015    Past Surgical History:  Procedure Laterality Date  . FOOT SURGERY  surgery x 2   With hardware placed  . LAPAROSCOPIC APPENDECTOMY N/A 09/22/2015   Procedure: APPENDECTOMY LAPAROSCOPIC;  Surgeon: Avel Peace, MD;  Location: [facility];  Service: General;  Laterality: N/A;       Family History  Problem Relation Age of Onset  . Cancer - Colon Mother     Social History   Tobacco Use  . Smoking status: Former Smoker    Years: 15.00    Types: Cigarettes    Quit date: 09/21/1988    Years since quitting: 32.3  . Smokeless tobacco: Never Used  Substance Use Topics  . Alcohol use: No  . Drug use: No    Home Medications Prior to Admission medications   Medication Sig  Start Date End Date Taking? Authorizing Provider  benzonatate (TESSALON) 100 MG capsule Take 1-2 capsules (100-200 mg total) by mouth 3 (three) times daily as needed for cough. 10/08/20   Wallis Bamberg, PA-C  cetirizine (ZYRTEC ALLERGY) 10 MG tablet Take 1 tablet (10 mg total) by mouth daily. 10/08/20   Wallis Bamberg, PA-C  omeprazole (PRILOSEC) 20 MG capsule Take 1 capsule (20 mg total) by mouth daily. 10/08/20   Wallis Bamberg, PA-C  promethazine-dextromethorphan (PROMETHAZINE-DM) 6.25-15 MG/5ML syrup Take 5 mLs by mouth at bedtime as needed for cough. 10/08/20   Wallis Bamberg, PA-C  pseudoephedrine (SUDAFED) 30 MG tablet Take 1 tablet (30 mg total) by mouth every 8 (eight) hours as needed for congestion. 10/08/20   Wallis Bamberg, PA-C    Allergies    Pork-derived products  Review of Systems   Review of Systems  Constitutional: Negative for fever.  HENT: Negative for sore throat.   Eyes: Negative for redness.  Respiratory: Negative for cough and shortness of breath.   Cardiovascular: Negative for chest pain, palpitations and leg swelling.  Gastrointestinal: Negative for abdominal pain, blood in stool, diarrhea and vomiting.  Genitourinary: Negative for dysuria and flank pain.  Musculoskeletal: Negative for back pain and neck pain.  Skin: Negative for rash.  Neurological: Positive for dizziness and light-headedness. Negative for speech difficulty, weakness, numbness and headaches.  Hematological: Does not bruise/bleed easily.  Psychiatric/Behavioral: Negative for confusion.    Physical Exam Updated Vital Signs BP (!) 149/75 (BP Location: Right Arm)   Pulse (!) 51   Temp 98.4 F (36.9 C) (Oral)   Resp 16   Ht 1.727 m (5\' 8" )   Wt 99.8 kg   SpO2 100%   BMI 33.45 kg/m   Physical Exam Vitals and nursing note reviewed.  Constitutional:      Appearance: Normal appearance. He is well-developed.  HENT:     Head: Atraumatic.     Nose: Nose normal.     Mouth/Throat:     Mouth: Mucous membranes  are moist.     Pharynx: Oropharynx is clear.  Eyes:     General: No scleral icterus.    Conjunctiva/sclera: Conjunctivae normal.     Pupils: Pupils are equal, round, and reactive to light.  Neck:     Vascular: No carotid bruit.     Trachea: No tracheal deviation.  Cardiovascular:     Rate and Rhythm: Normal rate and regular rhythm.     Pulses: Normal pulses.     Heart sounds: Normal heart sounds. No murmur heard. No friction rub. No gallop.   Pulmonary:     Effort: Pulmonary effort is normal. No accessory muscle usage or respiratory distress.     Breath sounds: Normal breath sounds.  Abdominal:     General: Bowel sounds are normal. There is no distension.     Palpations: Abdomen is soft.     Tenderness: There is no abdominal tenderness. There is no guarding.  Genitourinary:    Comments: No cva tenderness. Musculoskeletal:        General: No swelling or tenderness.     Cervical back: Normal range of motion and neck supple. No rigidity.     Right lower leg: No edema.     Left lower leg: No edema.  Skin:    General: Skin is warm and dry.     Findings: No rash.  Neurological:     Mental Status: He is alert.     Comments: Alert, speech clear. Motor/sens grossly intact. No pronator drift. Ambulates w steady gait, no ataxia or imbalance.   Psychiatric:        Mood and Affect: Mood normal.     ED Results / Procedures / Treatments   Labs (all labs ordered are listed, but only abnormal results are displayed) Results for orders placed or performed during the hospital encounter of 01/06/21  Basic metabolic panel  Result Value Ref Range   Sodium 139 135 - 145 mmol/L   Potassium 3.5 3.5 - 5.1 mmol/L   Chloride 101 98 - 111 mmol/L   CO2 31 22 - 32 mmol/L   Glucose, Bld 91 70 - 99 mg/dL   BUN 7 6 - 20 mg/dL   Creatinine, Ser 01/08/21 0.61 - 1.24 mg/dL   Calcium 9.0 8.9 - 4.31 mg/dL   GFR, Estimated 54.0 >08 mL/min   Anion gap 7 5 - 15  CBC  Result Value Ref Range   WBC 7.8 4.0 -  10.5 K/uL   RBC 4.52 4.22 - 5.81 MIL/uL   Hemoglobin 13.7 13.0 - 17.0 g/dL   HCT >67 61.9 - 50.9 %   MCV 99.6 80.0 - 100.0 fL   MCH 30.3 26.0 - 34.0 pg   MCHC 30.4 30.0 - 36.0 g/dL   RDW 32.6 71.2 - 45.8 %   Platelets 191 150 - 400 K/uL   nRBC 0.0 0.0 -  0.2 %   CT HEAD WO CONTRAST  Result Date: 01/06/2021 CLINICAL DATA:  Increasing headaches EXAM: CT HEAD WITHOUT CONTRAST TECHNIQUE: Contiguous axial images were obtained from the base of the skull through the vertex without intravenous contrast. COMPARISON:  None. FINDINGS: Brain: No evidence of acute infarction, hemorrhage, hydrocephalus, extra-axial collection or mass lesion/mass effect. Vascular: No hyperdense vessel or unexpected calcification. Skull: Normal. Negative for fracture or focal lesion. Sinuses/Orbits: No acute finding. Other: None. IMPRESSION: No acute intracranial abnormality noted. Electronically Signed   By: Alcide Clever M.D.   On: 01/06/2021 09:09    EKG EKG Interpretation  Date/Time:  Tuesday Jan 06 2021 07:48:38 EDT Ventricular Rate:  59 PR Interval:  136 QRS Duration: 92 QT Interval:  418 QTC Calculation: 413 R Axis:   58 Text Interpretation: Sinus bradycardia with sinus arrhythmia No significant change since last tracing Confirmed by Cathren Laine ([id number]) on 01/06/2021 8:16:47 AM   Radiology CT HEAD WO CONTRAST  Result Date: 01/06/2021 CLINICAL DATA:  Increasing headaches EXAM: CT HEAD WITHOUT CONTRAST TECHNIQUE: Contiguous axial images were obtained from the base of the skull through the vertex without intravenous contrast. COMPARISON:  None. FINDINGS: Brain: No evidence of acute infarction, hemorrhage, hydrocephalus, extra-axial collection or mass lesion/mass effect. Vascular: No hyperdense vessel or unexpected calcification. Skull: Normal. Negative for fracture or focal lesion. Sinuses/Orbits: No acute finding. Other: None. IMPRESSION: No acute intracranial abnormality noted. Electronically Signed   By: Alcide Clever M.D.   On: 01/06/2021 09:09    Procedures Procedures   Medications Ordered in ED Medications - No data to display  ED Course  I have reviewed the triage vital signs and the nursing notes.  Pertinent labs & imaging results that were available during my care of the patient were reviewed by me and considered in my medical decision making (see chart for details).    MDM Rules/Calculators/A&P                         Iv ns. Continuous pulse ox and cardiac monitoring. Stat labs and imaging.   Reviewed nursing notes and prior charts for additional history.   Labs reviewed/interpreted by me - wbc normal, hgb normal.   CT reviewed/interpreted by me - no hem. No sinusitis.   Po fluids, food, ambulate in hall. Acetaminophen po.  Pt currently appears stable for d/c.   Rec pcp f/u.  Return precautions provided.    Final Clinical Impression(s) / ED Diagnoses Final diagnoses:  None    Rx / DC Orders ED Discharge Orders    None       Cathren Laine, MD 01/06/21 [phone]

## 2021-01-06 NOTE — ED Triage Notes (Signed)
Pt reports he's been feeling dizzy/lightheaded since yesterday. Pt reports having that feeling even while sitting. Denies CP/SOB. Denies N/V/ Fever. VSS.

## 2021-01-06 NOTE — Discharge Instructions (Addendum)
It was our pleasure to provide your ER care today - we hope that you feel better.  Drink plenty of fluids/stay well hydrated.  Take acetaminophen or ibuprofen as need.  If nasal/sinus congestion - try zyrtec or claritin as need.   Follow up with primary care doctor in the coming week if symptoms fail to improve/resolve.  Return to ER if worse, new symptoms, fevers, new or severe pain, fainting, severe dizziness, problems w balance/gait, numbness/weakness, change in speech or vision, or other concern.

## 2021-01-28 ENCOUNTER — Ambulatory Visit: Payer: Medicare HMO | Admitting: Internal Medicine

## 2021-03-09 DIAGNOSIS — Z20822 Contact with and (suspected) exposure to covid-19: Secondary | ICD-10-CM | POA: Diagnosis not present

## 2021-10-06 ENCOUNTER — Other Ambulatory Visit: Payer: Self-pay

## 2022-01-06 IMAGING — CT CT HEAD W/O CM
4 series · 16 of 47 positions shown, 18 images · non-contrast
Comparison: None.

CLINICAL DATA: Increasing headaches

EXAM:
CT HEAD WITHOUT CONTRAST
TECHNIQUE: Contiguous axial images were obtained from the base of the skull
through the vertex without intravenous contrast.

[Series 2: head without · axial · non-contrast · 0.46mm/px · z∈[-145,-25]mm · 7 of 33 slices shown, 9 images]
[im 5/33  brain]
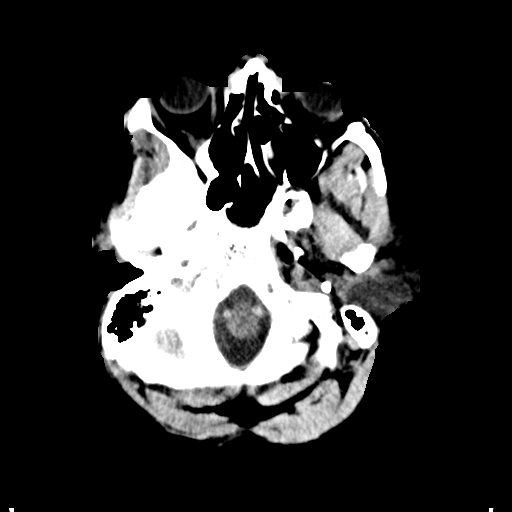
[im 5/33  bone]
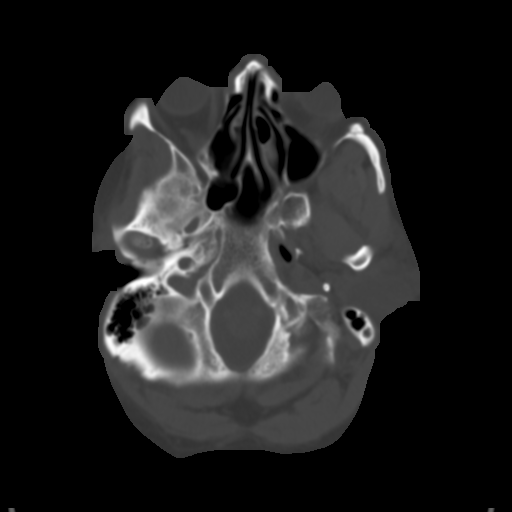
[im 9/33  brain]
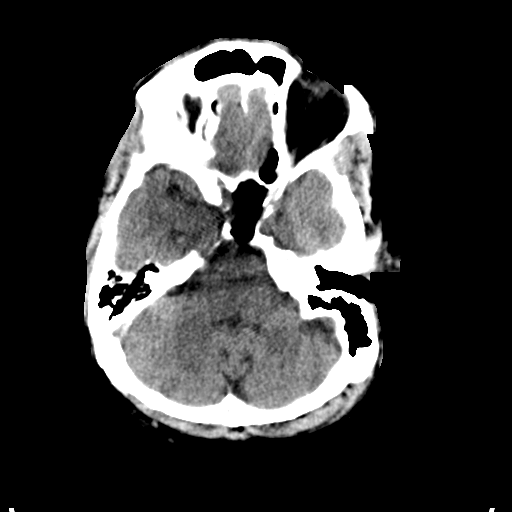
[im 13/33  brain]
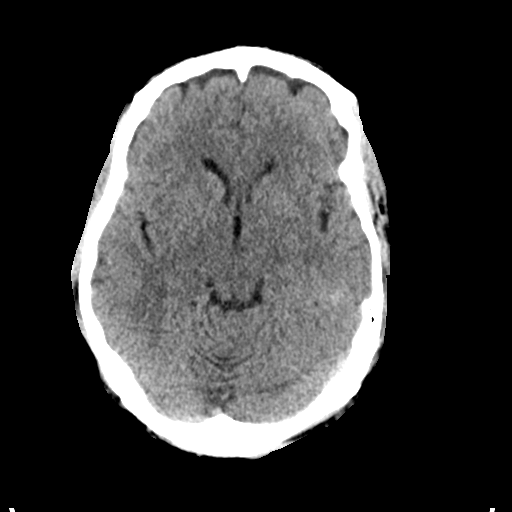
[im 17/33  brain]
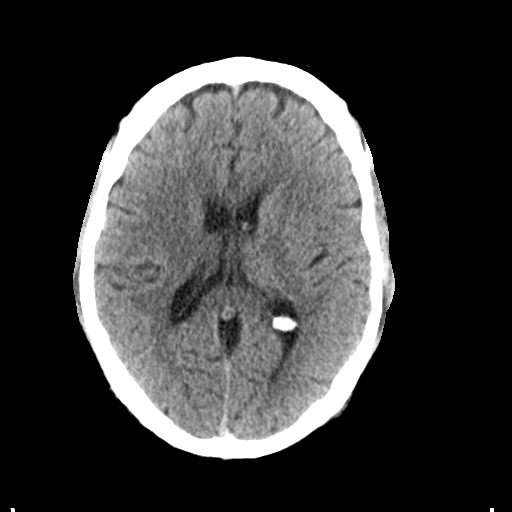
[im 21/33  brain]
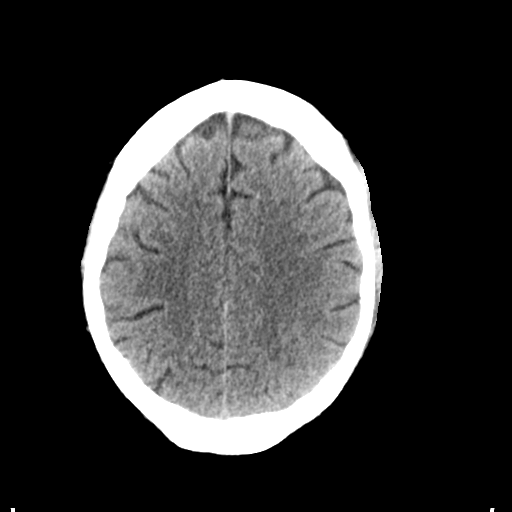
[im 21/33  bone]
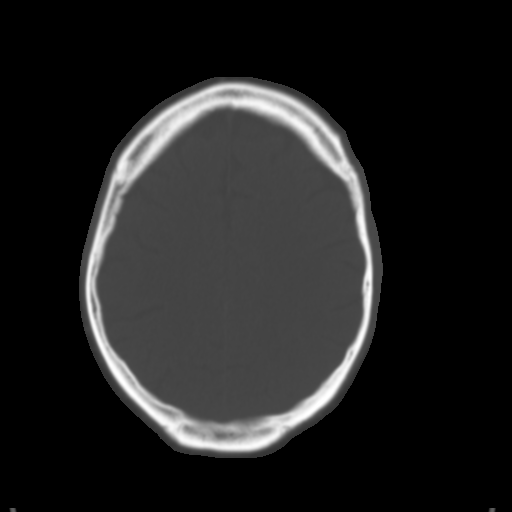
[im 25/33  brain]
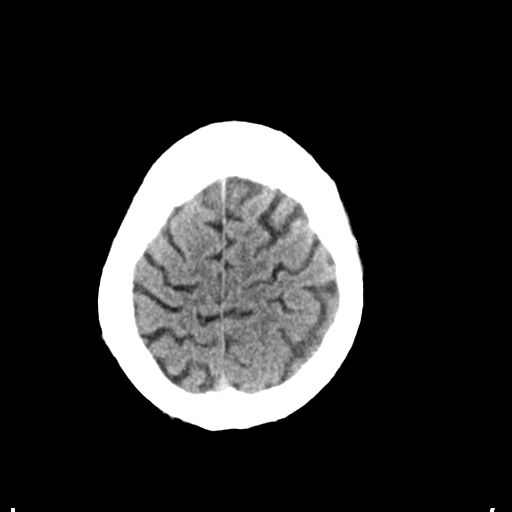
[im 29/33  brain]
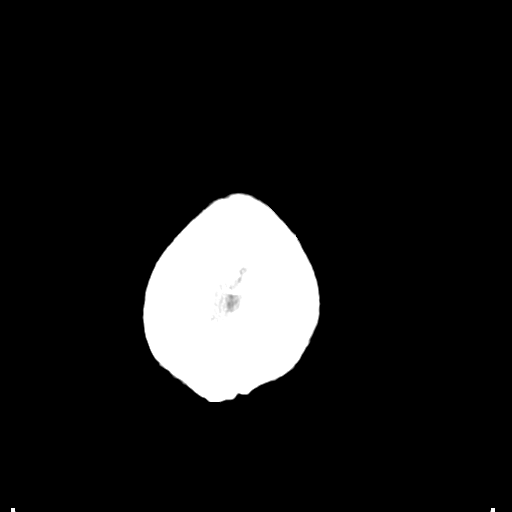

[Series 3: head bone · axial · 0.46mm/px · z∈[-149,-117]mm · 3 of 81 slices shown]
[im 9/81  bone]
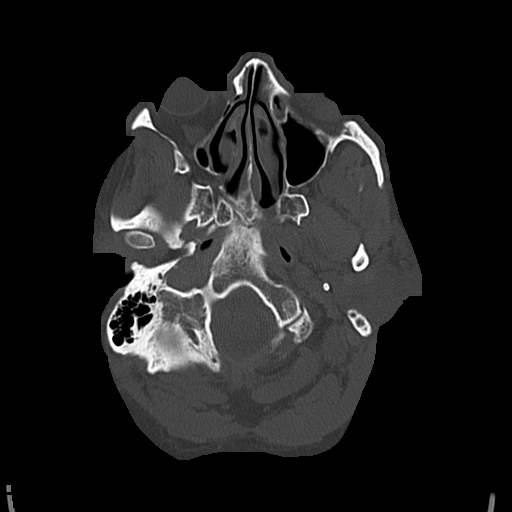
[im 17/81  bone]
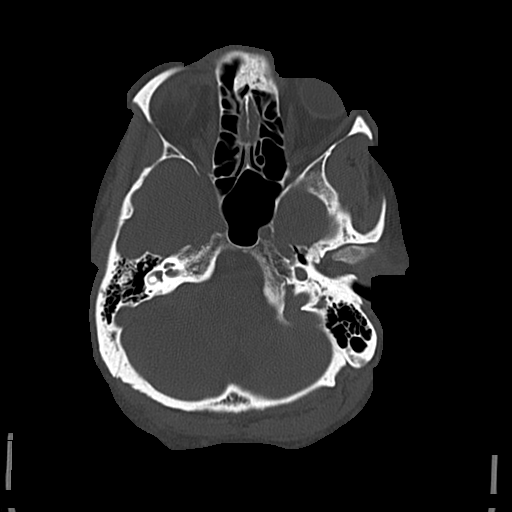
[im 25/81  bone]
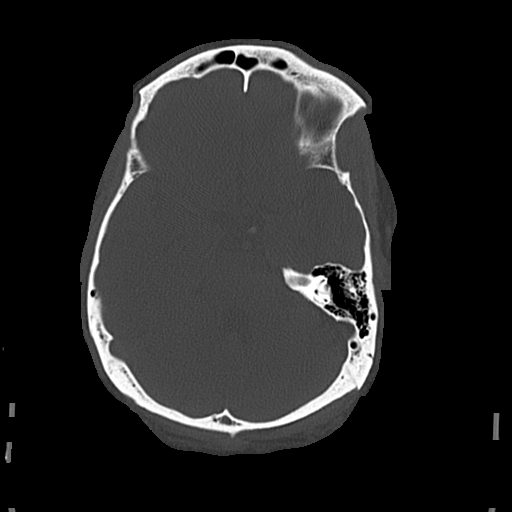

[Series 4: head without cor · coronal · non-contrast · 0.32mm/px · 3 of 73 slices shown]
[im 25/73  brain]
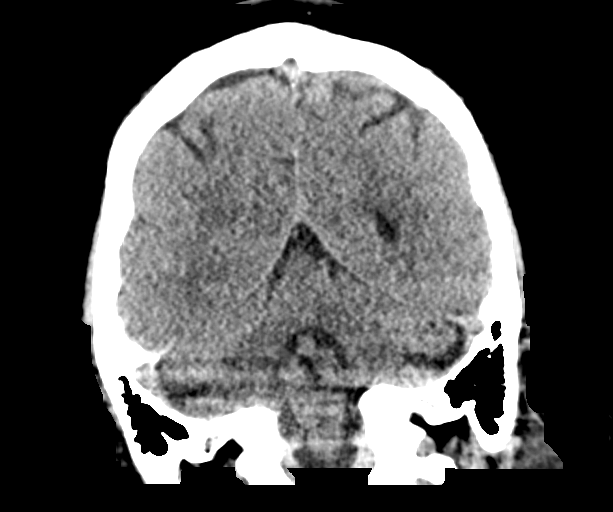
[im 33/73  brain]
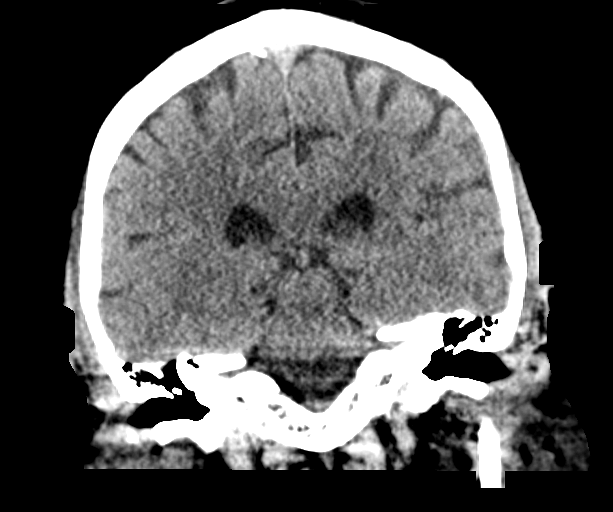
[im 41/73  brain]
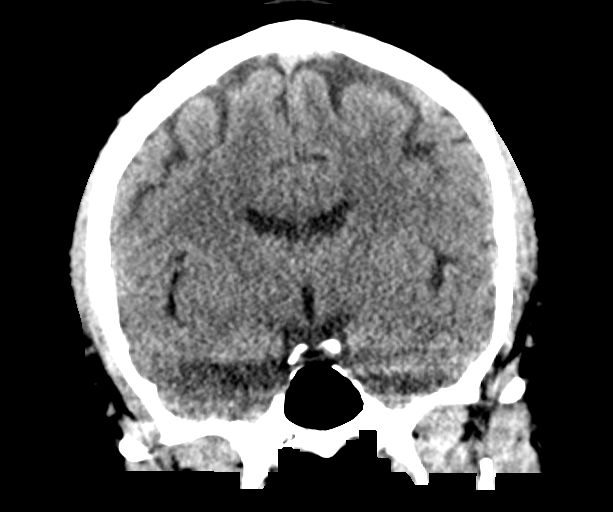

[Series 5: head without sag · sagittal · non-contrast · 0.32mm/px · 3 of 62 slices shown]
[im 21/62  brain]
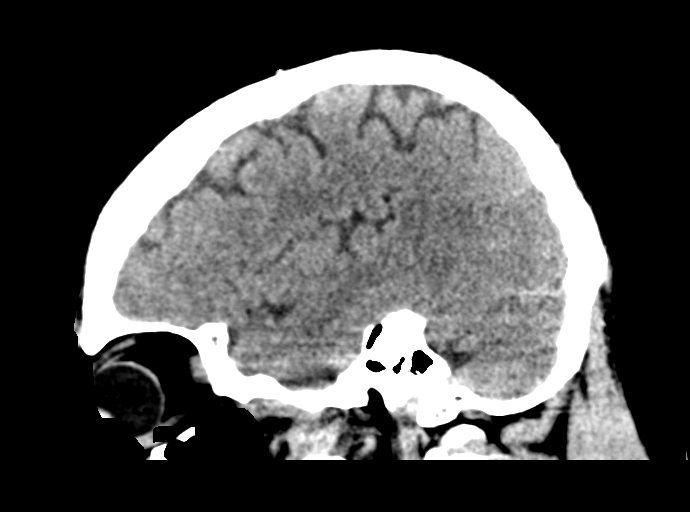
[im 31/62  brain]
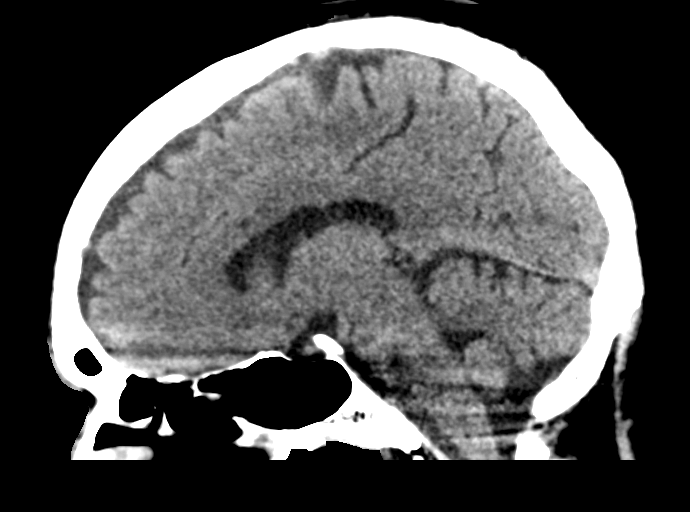
[im 41/62  brain]
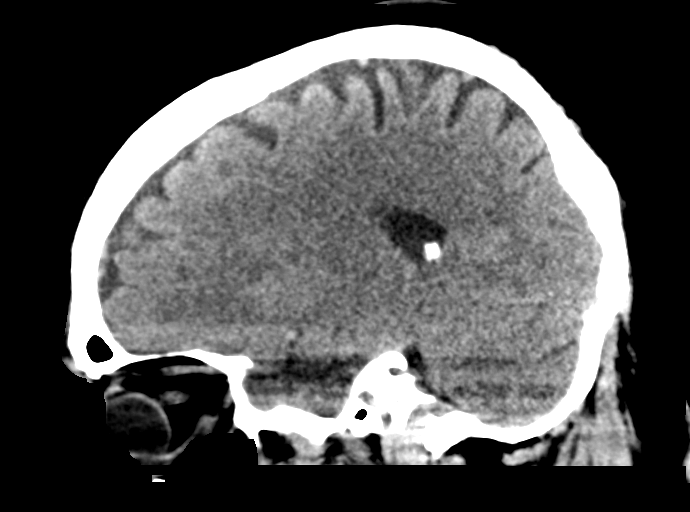

[16 of 47 positions shown; findings below may reference images not displayed]

FINDINGS: Brain: No evidence of acute infarction, hemorrhage, hydrocephalus,
extra-axial collection or mass lesion/mass effect.

Vascular: No hyperdense vessel or unexpected calcification.

Skull: Normal. Negative for fracture or focal lesion.

Sinuses/Orbits: No acute finding.

Other: None.
IMPRESSION: No acute intracranial abnormality noted.

## 2022-03-11 ENCOUNTER — Other Ambulatory Visit: Payer: Self-pay

## 2022-03-11 ENCOUNTER — Emergency Department (HOSPITAL_COMMUNITY): Payer: Medicare (Managed Care)

## 2022-03-11 ENCOUNTER — Emergency Department (HOSPITAL_COMMUNITY)
Admission: EM | Admit: 2022-03-11 | Discharge: 2022-03-11 | Disposition: A | Discharge status: Home / Self Care | Payer: Medicare (Managed Care) | Attending: Emergency Medicine | Admitting: Emergency Medicine

## 2022-03-11 ENCOUNTER — Encounter (HOSPITAL_COMMUNITY): Payer: Self-pay | Admitting: *Deleted

## 2022-03-11 DIAGNOSIS — R519 Headache, unspecified: Secondary | ICD-10-CM | POA: Diagnosis not present

## 2022-03-11 DIAGNOSIS — R404 Transient alteration of awareness: Secondary | ICD-10-CM | POA: Diagnosis not present

## 2022-03-11 DIAGNOSIS — R410 Disorientation, unspecified: Secondary | ICD-10-CM

## 2022-03-11 DIAGNOSIS — R4182 Altered mental status, unspecified: Secondary | ICD-10-CM | POA: Diagnosis present

## 2022-03-11 LAB — ETHANOL: Alcohol, Ethyl (B): <10 mg/dL (ref <10)

## 2022-03-11 LAB — COMPREHENSIVE METABOLIC PANEL
ALT: 28 U/L (ref 0–44)
AST: 18 U/L (ref 15–41)
Albumin: 4.2 g/dL (ref 3.5–5.0)
Alkaline Phosphatase: 71 U/L (ref 38–126)
Anion gap: 5 (ref 5–15)
BUN: 14 mg/dL (ref 8–23)
CO2: 29 mmol/L (ref 22–32)
Calcium: 9.2 mg/dL (ref 8.9–10.3)
Chloride: 103 mmol/L (ref 98–111)
Creatinine, Ser: 1.15 mg/dL (ref 0.61–1.24)
GFR, Estimated: >60 mL/min (ref >60)
Glucose, Bld: 122 mg/dL — ABNORMAL HIGH (ref 70–99)
Potassium: 4.1 mmol/L (ref 3.5–5.1)
Sodium: 137 mmol/L (ref 135–145)
Total Bilirubin: 1 mg/dL (ref 0.3–1.2)
Total Protein: 8.1 g/dL (ref 6.5–8.1)

## 2022-03-11 LAB — RAPID URINE DRUG SCREEN, HOSP PERFORMED
Amphetamines: NOT DETECTED
Barbiturates: NOT DETECTED
Benzodiazepines: NOT DETECTED
Cocaine: NOT DETECTED
Opiates: NOT DETECTED
Tetrahydrocannabinol: NOT DETECTED

## 2022-03-11 LAB — CBC WITH DIFFERENTIAL/PLATELET
Abs Immature Granulocytes: 0.02 10*3/uL (ref 0.00–0.07)
Basophils Absolute: 0 10*3/uL (ref 0.0–0.1)
Basophils Relative: 1 %
Eosinophils Absolute: 0 10*3/uL (ref 0.0–0.5)
Eosinophils Relative: 0 %
HCT: 46.2 % (ref 39.0–52.0)
Hemoglobin: 14.9 g/dL (ref 13.0–17.0)
Immature Granulocytes: 0 %
Lymphocytes Relative: 8 %
Lymphs Abs: 0.7 10*3/uL (ref 0.7–4.0)
MCH: 30.5 pg (ref 26.0–34.0)
MCHC: 32.3 g/dL (ref 30.0–36.0)
MCV: 94.5 fL (ref 80.0–100.0)
Monocytes Absolute: 0.4 10*3/uL (ref 0.1–1.0)
Monocytes Relative: 4 %
Neutro Abs: 7.7 10*3/uL (ref 1.7–7.7)
Neutrophils Relative %: 87 %
Platelets: 187 10*3/uL (ref 150–400)
RBC: 4.89 MIL/uL (ref 4.22–5.81)
RDW: 13 % (ref 11.5–15.5)
WBC: 8.9 10*3/uL (ref 4.0–10.5)
nRBC: 0 % (ref 0.0–0.2)

## 2022-03-11 LAB — URINALYSIS, ROUTINE W REFLEX MICROSCOPIC
Bilirubin Urine: NEGATIVE
Glucose, UA: NEGATIVE mg/dL
Ketones, ur: NEGATIVE mg/dL
Leukocytes,Ua: NEGATIVE
Nitrite: NEGATIVE
Protein, ur: NEGATIVE mg/dL
Specific Gravity, Urine: 1.015 (ref 1.005–1.030)
pH: 6 (ref 5.0–8.0)

## 2022-03-11 LAB — TROPONIN I (HIGH SENSITIVITY)
Troponin I (High Sensitivity): 7 ng/L (ref <18)
Troponin I (High Sensitivity): 8 ng/L (ref <18)

## 2022-03-11 LAB — CK: Total CK: 83 U/L (ref 49–397)

## 2022-03-11 LAB — LACTIC ACID, PLASMA: Lactic Acid, Venous: 1.3 mmol/L (ref 0.5–1.9)

## 2022-03-11 LAB — AMMONIA: Ammonia: 12 umol/L (ref 9–35)

## 2022-03-11 MED ORDER — LACTATED RINGERS IV BOLUS
1000.0000 mL | Freq: Once | INTRAVENOUS | Status: AC
  Administered 2022-03-11: 1000 mL via INTRAVENOUS

## 2022-03-11 MED ORDER — DIPHENHYDRAMINE HCL 50 MG/ML IJ SOLN
25.0000 mg | Freq: Once | INTRAMUSCULAR | Status: AC
  Administered 2022-03-11: 25 mg via INTRAVENOUS
  Filled 2022-03-11: qty 1

## 2022-03-11 MED ORDER — PROCHLORPERAZINE EDISYLATE 10 MG/2ML IJ SOLN
10.0000 mg | Freq: Once | INTRAMUSCULAR | Status: AC
Start: 2022-03-11 — End: 2022-03-11
  Administered 2022-03-11: 10 mg via INTRAVENOUS
  Filled 2022-03-11: qty 2

## 2022-03-11 NOTE — ED Notes (Signed)
Discharge instructions reviewed with patient. Patient denies any questions or concerns. Pt ambulatory out of ED.  

## 2022-03-11 NOTE — ED Notes (Signed)
The pt  continuous to c/o a headache

## 2022-03-11 NOTE — ED Provider Notes (Signed)
  Physical Exam  BP 133/75   Pulse 70   Temp 98.9 F (37.2 C) (Oral)   Resp 20   Ht 5\' 8"  (1.727 m)   Wt 109.8 kg   SpO2 97%   BMI 36.80 kg/m   Physical Exam  Procedures  Procedures  ED Course / MDM    Medical Decision Making Amount and/or Complexity of Data Reviewed Labs: ordered. Radiology: ordered.  Risk Prescription drug management.   61 year old gentleman presented to ER due to concern for altered mental status.  Decreased responsiveness at motel.  Complaining of headache.  Lab work, CT head all grossly reassuring.  Patient now no longer confused, still having somewhat of a headache.  Given headache cocktail, plan to further observe patient in ER.  I reassessed patient after headache cocktail and he states that his headache is completely resolved he has no ongoing symptoms and wants to be discharged home.  His vital signs remained stable.  No fever, very well-appearing, oriented to person place time and situation.  Advise follow-up with his primary care doctor, reviewed return precautions and discharged.      77, MD 03/11/22 [phone]

## 2022-03-11 NOTE — ED Triage Notes (Signed)
The pt arrived by [facility] from [address] the pt lives  pt c/o a headache since yesterday  does not usually have headaches   he is alert and oriented x 2 he knows the year and the president  not sure of the month o he has brusiing of his tongue  he denies any past medical history

## 2022-03-11 NOTE — ED Provider Notes (Signed)
[facility] EMERGENCY DEPARTMENT Provider Note   CSN: [id number] Arrival date & time: 03/11/22  0455     History  Chief Complaint  Patient presents with   Altered Mental Status    Phillip Deleon is a 61 y.o. male.  The history is provided by the patient and the EMS personnel. The history is limited by the condition of the patient (Altered mental status).  Altered Mental Status He was brought in by ambulance because of altered mental status.  He was reportedly found unresponsive at a motel where he is living and EMS thought he appeared postictal.  Mental state has been gradually improving.  He is complaining of a left-sided headache which he has had for the last 2 days.  He denies fever or chills.  He denies any nausea or vomiting.  He denies pit lip or tongue and denies incontinence.   Home Medications Prior to Admission medications   Medication Sig Start Date End Date Taking? Authorizing Provider  benzonatate (TESSALON) 100 MG capsule Take 1-2 capsules (100-200 mg total) by mouth 3 (three) times daily as needed for cough. 10/08/20   Wallis Bamberg, PA-C  cetirizine (ZYRTEC ALLERGY) 10 MG tablet Take 1 tablet (10 mg total) by mouth daily. 10/08/20   Wallis Bamberg, PA-C  omeprazole (PRILOSEC) 20 MG capsule Take 1 capsule (20 mg total) by mouth daily. 10/08/20   Wallis Bamberg, PA-C  promethazine-dextromethorphan (PROMETHAZINE-DM) 6.25-15 MG/5ML syrup Take 5 mLs by mouth at bedtime as needed for cough. 10/08/20   Wallis Bamberg, PA-C  pseudoephedrine (SUDAFED) 30 MG tablet Take 1 tablet (30 mg total) by mouth every 8 (eight) hours as needed for congestion. 10/08/20   Wallis Bamberg, PA-C      Allergies    Pork-derived products    Review of Systems   Review of Systems  Unable to perform ROS: Mental status change    Physical Exam Updated Vital Signs BP (!) 145/92 (BP Location: Right Arm)   Pulse 86   Temp 98.9 F (37.2 C) (Oral)   Resp 19   Ht 5\' 8"  (1.727 m)   Wt 109.8 kg    SpO2 97%   BMI 36.80 kg/m  Physical Exam Vitals and nursing note reviewed.   61 year old male, resting comfortably and in no acute distress. Vital signs are significant for mildly elevated blood pressure. Oxygen saturation is 97%, which is normal. Head is normocephalic and atraumatic. PERRLA, EOMI. Oropharynx is clear. Neck is nontender and supple without adenopathy or JVD. Back is nontender and there is no CVA tenderness. Lungs are clear without rales, wheezes, or rhonchi. Chest is nontender. Heart has regular rate and rhythm without murmur. Abdomen is soft, flat, nontender. Extremities have trace edema, full range of motion is present. Skin is warm and dry without rash. Neurologic: Awake and alert, oriented to person and partly oriented to place (knows he is in [facility] but unable to tell me anything more), not oriented to time.  Cranial nerves are intact, speech is normal.  Strength is 5/5 in all 4 extremities.  ED Results / Procedures / Treatments   Labs (all labs ordered are listed, but only abnormal results are displayed) Labs Reviewed  COMPREHENSIVE METABOLIC PANEL - Abnormal; Notable for the following components:      Result Value   Glucose, Bld 122 (*)    All other components within normal limits  CBC WITH DIFFERENTIAL/PLATELET  ETHANOL  CK  AMMONIA  LACTIC ACID, PLASMA  RAPID  URINE DRUG SCREEN, HOSP PERFORMED  URINALYSIS, ROUTINE W REFLEX MICROSCOPIC  TROPONIN I (HIGH SENSITIVITY)  TROPONIN I (HIGH SENSITIVITY)    EKG EKG Interpretation  Date/Time:  Thursday March 11 2022 05:09:46 EDT Ventricular Rate:  75 PR Interval:  129 QRS Duration: 93 QT Interval:  371 QTC Calculation: 415 R Axis:   69 Text Interpretation: Sinus rhythm Ventricular premature complex Probable left atrial enlargement Borderline T abnormalities, inferior leads When compared with ECG of 01/06/2021, Premature ventricular complexes are now present Confirmed by Delora Fuel  ([id number]) on 03/11/2022 5:23:48 AM  Radiology CT Head Wo Contrast  Result Date: 03/11/2022 CLINICAL DATA:  61 year old male with headache and altered mental status. EXAM: CT HEAD WITHOUT CONTRAST TECHNIQUE: Contiguous axial images were obtained from the base of the skull through the vertex without intravenous contrast. RADIATION DOSE REDUCTION: This exam was performed according to the departmental dose-optimization program which includes automated exposure control, adjustment of the mA and/or kV according to patient size and/or use of iterative reconstruction technique. COMPARISON:  Head CT 01/06/2021. FINDINGS: Brain: Cerebral volume is within normal limits for age. No midline shift, ventriculomegaly, mass effect, evidence of mass lesion, intracranial hemorrhage or evidence of cortically based acute infarction. Gray-white matter differentiation is within normal limits throughout the brain. No encephalomalacia identified. Vascular: Faint calcified atherosclerosis at the skull base. No suspicious intracranial vascular hyperdensity. Skull: Negative.  No acute osseous abnormality identified. Sinuses/Orbits: Visualized paranasal sinuses and mastoids are stable and well aerated. Other: Orbit and scalp soft tissues appears stable, negative. IMPRESSION: Normal for age noncontrast Head CT, stable from last year. Electronically Signed   By: Genevie Ann M.D.   On: 03/11/2022 06:36   DG Chest Port 1 View  Result Date: 03/11/2022 CLINICAL DATA:  Altered mental status. EXAM: PORTABLE CHEST 1 VIEW COMPARISON:  PA Lat 03/16/2020. FINDINGS: The heart size and mediastinal contours are within normal limits. Both lungs are clear. The visualized skeletal structures are intact, with again noted severe S shaped thoracolumbar scoliosis. There is overlying monitor wiring. IMPRESSION: No active disease.  Stable chest with severe scoliosis. Electronically Signed   By: Telford Nab M.D.   On: 03/11/2022 05:48    Procedures Procedures   Cardiac monitor shows normal sinus rhythm with frequent PVCs, per my interpretation.  Medications Ordered in ED Medications - No data to display  ED Course/ Medical Decision Making/ A&P                           Medical Decision Making Amount and/or Complexity of Data Reviewed Labs: ordered. Radiology: ordered.  Risk Prescription drug management.   Altered mental status of uncertain cause.  Consider postictal state, metabolic encephalopathy, infection, drug intoxication, intracranial tumor, intracranial injury.  I have reviewed and interpreted his ECG, and my interpretation is sinus rhythm with frequent PVCs, borderline T wave abnormality.  T wave abnormality was present on prior ECG, PVCs were not.  I have ordered imaging studies of chest x-ray and CT of head.  I have ordered laboratory testing of CBC, comprehensive metabolic panel, ethanol level, lactic acid level, CK, urine drug screen, urinalysis.  I have reviewed and interpreted his laboratory tests and my interpretation is normal labs except for minimally elevated random glucose.  Chest x-ray shows no acute process, CT of the head is normal for age.  I have independently viewed all of these images, and agree with the radiologist's interpretation.  Patient was reevaluated, he is now  completely awake and alert and oriented but complaining of a left-sided headache.  I have ordered a headache cocktail of lactated Ringer's solution, diphenhydramine, prochlorperazine.  Case is signed out to Dr. Stevie Kern to evaluate response to above-noted treatment.  He also would benefit from outpatient referral to neurology to consider EEG for possible seizure.  Final Clinical Impression(s) / ED Diagnoses Final diagnoses:  Bad headache  Transient confusion    Rx / DC Orders ED Discharge Orders     None         Dione Booze, MD 03/11/22 [phone]

## 2022-03-11 NOTE — Discharge Instructions (Signed)
Follow-up with your primary care doctor.  Come back to ER if you have any further episodes of severe headache, confusion, or other new concerning symptoms.

## 6185-01-14 DEATH — deceased
